# Patient Record
Sex: Female | Born: 1960 | Race: Black or African American | Hispanic: No | Marital: Married | Smoking: Current every day smoker
Health system: Southern US, Community
[De-identification: ages and names within clinical notes are randomized; demographics above are authoritative.]

## PROBLEM LIST (undated history)

## (undated) DIAGNOSIS — E7800 Pure hypercholesterolemia, unspecified: Secondary | ICD-10-CM

## (undated) DIAGNOSIS — I10 Essential (primary) hypertension: Secondary | ICD-10-CM

## (undated) HISTORY — PX: CHOLECYSTECTOMY: SHX55

## (undated) HISTORY — PX: ABDOMINAL HYSTERECTOMY: SHX81

## (undated) HISTORY — PX: SHOULDER SURGERY: SHX246

## (undated) HISTORY — PX: KNEE SURGERY: SHX244

---

## 2005-09-09 ENCOUNTER — Emergency Department (HOSPITAL_COMMUNITY): Admission: EM | Admit: 2005-09-09 | Discharge: 2005-09-09 | Payer: Self-pay | Admitting: Family Medicine

## 2006-02-27 ENCOUNTER — Emergency Department (HOSPITAL_COMMUNITY): Admission: EM | Admit: 2006-02-27 | Discharge: 2006-02-27 | Payer: Self-pay | Admitting: Family Medicine

## 2006-04-11 ENCOUNTER — Emergency Department (HOSPITAL_COMMUNITY): Admission: EM | Admit: 2006-04-11 | Discharge: 2006-04-11 | Payer: Self-pay | Admitting: Emergency Medicine

## 2007-03-11 ENCOUNTER — Emergency Department (HOSPITAL_COMMUNITY): Admission: EM | Admit: 2007-03-11 | Discharge: 2007-03-11 | Payer: Self-pay | Admitting: Emergency Medicine

## 2007-03-11 ENCOUNTER — Ambulatory Visit (HOSPITAL_COMMUNITY): Admission: RE | Admit: 2007-03-11 | Discharge: 2007-03-11 | Payer: Self-pay | Admitting: Emergency Medicine

## 2007-03-13 ENCOUNTER — Emergency Department (HOSPITAL_COMMUNITY): Admission: EM | Admit: 2007-03-13 | Discharge: 2007-03-13 | Payer: Self-pay | Admitting: Emergency Medicine

## 2011-10-23 ENCOUNTER — Emergency Department (HOSPITAL_COMMUNITY)
Admission: EM | Admit: 2011-10-23 | Discharge: 2011-10-23 | Disposition: A | Discharge status: Home / Self Care | Payer: No Typology Code available for payment source | Attending: Emergency Medicine | Admitting: Emergency Medicine

## 2011-10-23 DIAGNOSIS — R51 Headache: Secondary | ICD-10-CM | POA: Insufficient documentation

## 2011-10-25 ENCOUNTER — Inpatient Hospital Stay (HOSPITAL_COMMUNITY)
Admission: RE | Admit: 2011-10-25 | Discharge: 2011-10-25 | Disposition: A | Discharge status: Home / Self Care | Payer: No Typology Code available for payment source | Source: Ambulatory Visit | Attending: Family Medicine | Admitting: Family Medicine

## 2013-06-12 ENCOUNTER — Other Ambulatory Visit: Payer: Self-pay

## 2013-06-12 ENCOUNTER — Encounter (HOSPITAL_COMMUNITY): Payer: Self-pay | Admitting: *Deleted

## 2013-06-12 ENCOUNTER — Emergency Department (HOSPITAL_COMMUNITY): Payer: BC Managed Care – PPO

## 2013-06-12 ENCOUNTER — Emergency Department (HOSPITAL_COMMUNITY)
Admission: EM | Admit: 2013-06-12 | Discharge: 2013-06-12 | Discharge status: Against Medical Advice | Payer: BC Managed Care – PPO | Attending: Emergency Medicine | Admitting: Emergency Medicine

## 2013-06-12 DIAGNOSIS — R110 Nausea: Secondary | ICD-10-CM | POA: Insufficient documentation

## 2013-06-12 DIAGNOSIS — E7800 Pure hypercholesterolemia, unspecified: Secondary | ICD-10-CM | POA: Insufficient documentation

## 2013-06-12 DIAGNOSIS — R079 Chest pain, unspecified: Secondary | ICD-10-CM | POA: Insufficient documentation

## 2013-06-12 DIAGNOSIS — Z79899 Other long term (current) drug therapy: Secondary | ICD-10-CM | POA: Insufficient documentation

## 2013-06-12 DIAGNOSIS — E876 Hypokalemia: Secondary | ICD-10-CM

## 2013-06-12 DIAGNOSIS — F17200 Nicotine dependence, unspecified, uncomplicated: Secondary | ICD-10-CM | POA: Insufficient documentation

## 2013-06-12 DIAGNOSIS — I10 Essential (primary) hypertension: Secondary | ICD-10-CM | POA: Insufficient documentation

## 2013-06-12 HISTORY — DX: Essential (primary) hypertension: I10

## 2013-06-12 HISTORY — DX: Pure hypercholesterolemia, unspecified: E78.00

## 2013-06-12 LAB — COMPREHENSIVE METABOLIC PANEL
ALT: 20 U/L (ref 0–35)
AST: 30 U/L (ref 0–37)
Alkaline Phosphatase: 101 U/L (ref 39–117)
CO2: 26 mEq/L (ref 19–32)
Calcium: 10 mg/dL (ref 8.4–10.5)
Chloride: 99 mEq/L (ref 96–112)
GFR calc Af Amer: >90 mL/min (ref >90)
GFR calc non Af Amer: >90 mL/min (ref >90)
Glucose, Bld: 132 mg/dL — ABNORMAL HIGH (ref 70–99)
Potassium: 3.3 mEq/L — ABNORMAL LOW (ref 3.5–5.1)
Sodium: 135 mEq/L (ref 135–145)
Total Bilirubin: 0.3 mg/dL (ref 0.3–1.2)

## 2013-06-12 LAB — CBC WITH DIFFERENTIAL/PLATELET
Basophils Absolute: 0.1 10*3/uL (ref 0.0–0.1)
Eosinophils Relative: 3 % (ref 0–5)
Lymphocytes Relative: 46 % (ref 12–46)
Lymphs Abs: 4.9 10*3/uL — ABNORMAL HIGH (ref 0.7–4.0)
MCV: 94.8 fL (ref 78.0–100.0)
Neutro Abs: 4.6 10*3/uL (ref 1.7–7.7)
Platelets: 278 10*3/uL (ref 150–400)
RBC: 4.26 MIL/uL (ref 3.87–5.11)
RDW: 12.6 % (ref 11.5–15.5)
WBC: 10.5 10*3/uL (ref 4.0–10.5)

## 2013-06-12 MED ORDER — NITROGLYCERIN 0.4 MG SL SUBL
0.4000 mg | SUBLINGUAL_TABLET | SUBLINGUAL | Status: DC | PRN
  Administered 2013-06-12: 0.4 mg via SUBLINGUAL
  Filled 2013-06-12: qty 25

## 2013-06-12 MED ORDER — POTASSIUM CHLORIDE CRYS ER 20 MEQ PO TBCR
40.0000 meq | EXTENDED_RELEASE_TABLET | Freq: Once | ORAL | Status: AC
  Administered 2013-06-12: 40 meq via ORAL
  Filled 2013-06-12: qty 2

## 2013-06-12 MED ORDER — ASPIRIN 325 MG PO TABS
325.0000 mg | ORAL_TABLET | Freq: Once | ORAL | Status: AC
  Administered 2013-06-12: 325 mg via ORAL
  Filled 2013-06-12: qty 1

## 2013-06-12 NOTE — ED Provider Notes (Signed)
History     CSN: [id number]  Arrival date & time 06/12/13  0039   First MD Initiated Contact with Patient 06/12/13 0245      Chief Complaint  Patient presents with  . Chest Pain     Patient is a 52 y.o. female presenting with chest pain. The history is provided by the patient.  Chest Pain Pain location:  Substernal area Pain quality: burning   Radiates to: bilateral back. Pain radiates to the back: yes   Pain severity:  Moderate Duration: several hours. Timing:  Constant Progression:  Worsening Chronicity:  New Relieved by:  None tried Worsened by:  Nothing tried Associated symptoms: nausea   Associated symptoms: no abdominal pain, no diaphoresis, no fever, no shortness of breath, no syncope and not vomiting     Past Medical History  Diagnosis Date  . Hypertension   . High cholesterol     Past Surgical History  Procedure Laterality Date  . Cholecystectomy    . Knee surgery    . Shoulder surgery    . Abdominal hysterectomy      History reviewed. No pertinent family history.  History  Substance Use Topics  . Smoking status: Current Every Day Smoker  . Smokeless tobacco: Not on file  . Alcohol Use: No    OB History   Grav Para Term Preterm Abortions TAB SAB Ect Mult Living                  Review of Systems  Constitutional: Negative for fever and diaphoresis.  Respiratory: Negative for shortness of breath.   Cardiovascular: Positive for chest pain. Negative for syncope.  Gastrointestinal: Positive for nausea. Negative for vomiting and abdominal pain.  All other systems reviewed and are negative.    Allergies  Review of patient's allergies indicates no known allergies.  Home Medications   Current Outpatient Rx  Name  Route  Sig  Dispense  Refill  . atorvastatin (LIPITOR) 20 MG tablet   Oral   Take 20 mg by mouth daily.         Marland Kitchen losartan-hydrochlorothiazide (HYZAAR) 50-12.5 MG per tablet   Oral   Take 1 tablet by mouth daily.            BP 138/74  Pulse 72  Temp(Src) 97.8 F (36.6 C) (Oral)  Resp 18  SpO2 97% BP 147/76  Pulse 69  Temp(Src) 97.8 F (36.6 C) (Oral)  Resp 17  SpO2 95%   Physical Exam CONSTITUTIONAL: Well developed/well nourished HEAD: Normocephalic/atraumatic EYES: EOMI/PERRL ENMT: Mucous membranes moist NECK: supple no meningeal signs SPINE:entire spine nontender CV: S1/S2 noted, no murmurs/rubs/gallops noted LUNGS: Lungs are clear to auscultation bilaterally, no apparent distress Chest - no tenderness to palpation of chest wall ABDOMEN: soft, nontender, no rebound or guarding GU:no cva tenderness NEURO: Pt is awake/alert, moves all extremitiesx4 EXTREMITIES: pulses normal, full ROM SKIN: warm, color normal PSYCH: no abnormalities of mood noted  ED Course  Procedures (including critical care time)  Labs Reviewed  CBC WITH DIFFERENTIAL - Abnormal; Notable for the following:    Lymphs Abs 4.9 (*)    All other components within normal limits  COMPREHENSIVE METABOLIC PANEL - Abnormal; Notable for the following:    Potassium 3.3 (*)    Glucose, Bld 132 (*)    All other components within normal limits  POCT I-STAT TROPONIN I   Dg Chest 2 View  06/12/2013   *RADIOLOGY REPORT*  Clinical Data: Chest pain and shortness of  breath.  CHEST - 2 VIEW  Comparison: No priors.  Findings: Lung volumes are normal.  No consolidative airspace disease.  No pleural effusions.  No pneumothorax.  No pulmonary nodule or mass noted.  Pulmonary vasculature and the cardiomediastinal silhouette are within normal limits.  Surgical clips project over the right upper quadrant of the abdomen, likely from prior cholecystectomy.  IMPRESSION: 1. No radiographic evidence of acute cardiopulmonary disease.   Original Report Authenticated By: Trudie Reed, M.D.    Pt improved after ASA and nitroglycerin.  She denies ever having this pain previously However she does have risk factors for CAD, and she appeared  uncomfortable on my initial evaluation I recommended admission and possible further cardiac evaluation and potentially stress imaging Pt refused She can f/u with her PCP I doubt PE or aortic dissection but concern for possible ACS   I discussed risk of death/disability of leaving against medical advice and the patient accepted these risks.  The patient is awake/alert able to make decisions, and not intoxicated Patient discharged against medical advice.  Husband at bedside also aware of my advice.  Pt still decided to sign out AMA      MDM  Nursing notes including past medical history and social history reviewed and considered in documentation xrays reviewed and considered Labs/vital reviewed and considered        Date: 06/12/2013  Rate: 81  Rhythm: normal sinus rhythm  QRS Axis: normal  Intervals: normal  ST/T Wave abnormalities: nonspecific ST changes  Conduction Disutrbances:none  Narrative Interpretation:   Old EKG Reviewed: unchanged    Joya Gaskins, MD 06/12/13 [phone]

## 2013-06-12 NOTE — ED Notes (Signed)
Pt. Left AMA. States "I will follow up with my doctor and get the stress test done". Explained benefits and risks to staying and leaving. Pt. Verbalized understanding.

## 2013-06-12 NOTE — ED Notes (Signed)
Pt states that she has pain in her center of her chest that is a burning pain that wraps around to her back. Pt states she took antiacids for the pain but that they have not help. Pt took antiacids about an hour ago.

## 2013-06-12 NOTE — ED Notes (Signed)
Dr. Wickline at bedside.  

## 2013-06-12 NOTE — ED Notes (Signed)
PT declines to take second nitroglycerin SL

## 2014-08-22 ENCOUNTER — Emergency Department (HOSPITAL_COMMUNITY): Payer: Self-pay

## 2014-08-22 ENCOUNTER — Inpatient Hospital Stay (HOSPITAL_COMMUNITY)
Admission: EM | Admit: 2014-08-22 | Discharge: 2014-08-24 | DRG: 312 | Disposition: A | Discharge status: Home / Self Care | Payer: BC Managed Care – PPO | Attending: Internal Medicine | Admitting: Internal Medicine

## 2014-08-22 ENCOUNTER — Encounter (HOSPITAL_COMMUNITY): Payer: Self-pay | Admitting: Emergency Medicine

## 2014-08-22 DIAGNOSIS — Z8673 Personal history of transient ischemic attack (TIA), and cerebral infarction without residual deficits: Secondary | ICD-10-CM

## 2014-08-22 DIAGNOSIS — E7800 Pure hypercholesterolemia, unspecified: Secondary | ICD-10-CM | POA: Diagnosis present

## 2014-08-22 DIAGNOSIS — Z720 Tobacco use: Secondary | ICD-10-CM | POA: Diagnosis present

## 2014-08-22 DIAGNOSIS — R55 Syncope and collapse: Principal | ICD-10-CM | POA: Diagnosis present

## 2014-08-22 DIAGNOSIS — I10 Essential (primary) hypertension: Secondary | ICD-10-CM | POA: Diagnosis present

## 2014-08-22 DIAGNOSIS — F17200 Nicotine dependence, unspecified, uncomplicated: Secondary | ICD-10-CM

## 2014-08-22 LAB — COMPREHENSIVE METABOLIC PANEL
ALBUMIN: 3.6 g/dL (ref 3.5–5.2)
ALK PHOS: 85 U/L (ref 39–117)
ALT: 41 U/L — AB (ref 0–35)
ANION GAP: 13 (ref 5–15)
AST: 26 U/L (ref 0–37)
BILIRUBIN TOTAL: 0.2 mg/dL — AB (ref 0.3–1.2)
BUN: 11 mg/dL (ref 6–23)
CHLORIDE: 105 meq/L (ref 96–112)
CO2: 25 mEq/L (ref 19–32)
Calcium: 9.2 mg/dL (ref 8.4–10.5)
Creatinine, Ser: 0.61 mg/dL (ref 0.50–1.10)
GFR calc Af Amer: >90 mL/min (ref >90)
GFR calc non Af Amer: >90 mL/min (ref >90)
Glucose, Bld: 105 mg/dL — ABNORMAL HIGH (ref 70–99)
POTASSIUM: 4.1 meq/L (ref 3.7–5.3)
Sodium: 143 mEq/L (ref 137–147)
Total Protein: 7.1 g/dL (ref 6.0–8.3)

## 2014-08-22 LAB — CBC WITH DIFFERENTIAL/PLATELET
BASOS PCT: 0 % (ref 0–1)
Basophils Absolute: 0 10*3/uL (ref 0.0–0.1)
Eosinophils Absolute: 0.3 10*3/uL (ref 0.0–0.7)
Eosinophils Relative: 4 % (ref 0–5)
HCT: 39.6 % (ref 36.0–46.0)
HEMOGLOBIN: 13.9 g/dL (ref 12.0–15.0)
LYMPHS ABS: 2.5 10*3/uL (ref 0.7–4.0)
Lymphocytes Relative: 36 % (ref 12–46)
MCH: 33.6 pg (ref 26.0–34.0)
MCHC: 35.1 g/dL (ref 30.0–36.0)
MCV: 95.7 fL (ref 78.0–100.0)
MONOS PCT: 4 % (ref 3–12)
Monocytes Absolute: 0.3 10*3/uL (ref 0.1–1.0)
NEUTROS ABS: 3.9 10*3/uL (ref 1.7–7.7)
NEUTROS PCT: 56 % (ref 43–77)
Platelets: 200 10*3/uL (ref 150–400)
RBC: 4.14 MIL/uL (ref 3.87–5.11)
RDW: 12.8 % (ref 11.5–15.5)
WBC: 7 10*3/uL (ref 4.0–10.5)

## 2014-08-22 LAB — D-DIMER, QUANTITATIVE: D-Dimer, Quant: 0.32 ug/mL-FEU (ref 0.00–0.48)

## 2014-08-22 LAB — RAPID URINE DRUG SCREEN, HOSP PERFORMED
AMPHETAMINES: NOT DETECTED
BARBITURATES: NOT DETECTED
BENZODIAZEPINES: NOT DETECTED
COCAINE: NOT DETECTED
Opiates: NOT DETECTED
TETRAHYDROCANNABINOL: NOT DETECTED

## 2014-08-22 LAB — TROPONIN I: Troponin I: <0.3 ng/mL (ref <0.30)

## 2014-08-22 MED ORDER — SODIUM CHLORIDE 0.9 % IV SOLN
INTRAVENOUS | Status: DC
  Administered 2014-08-22 – 2014-08-24 (×4): via INTRAVENOUS

## 2014-08-22 MED ORDER — ACETAMINOPHEN 650 MG RE SUPP: 650.0000 mg | Freq: Four times a day (QID) | RECTAL | Status: DC | PRN

## 2014-08-22 MED ORDER — NICOTINE 21 MG/24HR TD PT24
21.0000 mg | MEDICATED_PATCH | Freq: Every day | TRANSDERMAL | Status: DC
  Administered 2014-08-23 – 2014-08-24 (×2): 21 mg via TRANSDERMAL
  Filled 2014-08-22 (×2): qty 1

## 2014-08-22 MED ORDER — ASPIRIN EC 81 MG PO TBEC
81.0000 mg | DELAYED_RELEASE_TABLET | Freq: Every day | ORAL | Status: DC
  Administered 2014-08-23 – 2014-08-24 (×2): 81 mg via ORAL
  Filled 2014-08-22 (×2): qty 1

## 2014-08-22 MED ORDER — ENOXAPARIN SODIUM 40 MG/0.4ML ~~LOC~~ SOLN
40.0000 mg | SUBCUTANEOUS | Status: DC
  Administered 2014-08-23 – 2014-08-24 (×2): 40 mg via SUBCUTANEOUS
  Filled 2014-08-22 (×2): qty 0.4

## 2014-08-22 MED ORDER — ACETAMINOPHEN 325 MG PO TABS
650.0000 mg | ORAL_TABLET | Freq: Four times a day (QID) | ORAL | Status: DC | PRN
  Administered 2014-08-22 – 2014-08-23 (×3): 650 mg via ORAL
  Filled 2014-08-22 (×3): qty 2

## 2014-08-22 NOTE — ED Notes (Signed)
Patient transported to CT 

## 2014-08-22 NOTE — ED Notes (Signed)
Received pt from home with c/o Dizziness and syncopal episode lasting about 1 minute. Symptoms onset about 45 mins PTA. Pt c/o nausea but currently relieved. Pt has generalized weakness. Initial pulse ox for EMS was 91-92% on room air. Pt given 2 L O2 by EMS pulse ox increased to 95-96%.

## 2014-08-22 NOTE — ED Notes (Signed)
Pt escorted to bathroom without difficulty. When escorted back to room, pt became dizzy and weak at nurses' station. Placed pt in a chair before she could fall and brought her back to her room in a wheelchair. Vital signs taken and were WNL.

## 2014-08-22 NOTE — ED Provider Notes (Signed)
CSN: [id number]     Arrival date & time 08/22/14  1721 History   First MD Initiated Contact with Patient 08/22/14 1735     Chief Complaint  Patient presents with  . Dizziness  . Loss of Consciousness      HPI Received pt from home with c/o Dizziness and syncopal episode lasting about 1 minute. Symptoms onset about 45 mins PTA. Pt c/o nausea but currently relieved. Pt has generalized weakness. Initial pulse ox for EMS was 91-92% on room air. Pt given 2 L O2 by EMS pulse ox increased to 95-96%.  Past Medical History  Diagnosis Date  . Hypertension   . High cholesterol    Past Surgical History  Procedure Laterality Date  . Cholecystectomy    . Knee surgery    . Shoulder surgery    . Abdominal hysterectomy     No family history on file. History  Substance Use Topics  . Smoking status: Current Every Day Smoker -- 1.00 packs/day  . Smokeless tobacco: Not on file  . Alcohol Use: No   OB History   Grav Para Term Preterm Abortions TAB SAB Ect Mult Living                 Review of Systems  All other systems reviewed and are negative  Allergies  Review of patient's allergies indicates no known allergies.  Home Medications   Prior to Admission medications   Medication Sig Start Date End Date Taking? Authorizing Provider  Cholecalciferol (VITAMIN D PO) Take 1 tablet by mouth daily.   Yes Historical Provider, MD  Cyanocobalamin (VITAMIN B-12 PO) Take 1 tablet by mouth daily.   Yes Historical Provider, MD  losartan-hydrochlorothiazide (HYZAAR) 50-12.5 MG per tablet Take 1 tablet by mouth daily.   Yes Historical Provider, MD  Multiple Vitamin (MULTIVITAMIN WITH MINERALS) TABS tablet Take 1 tablet by mouth daily.   Yes Historical Provider, MD   BP 113/65  Pulse 66  Temp(Src) 98 F (36.7 C) (Oral)  Resp 17  Ht  (1.702 m)  SpO2 93% Physical Exam Physical Exam  Nursing note and vitals reviewed. Constitutional: She is oriented to person, place, and time. She appears  well-developed and well-nourished. No distress.  HENT:  Head: Normocephalic and atraumatic.  Eyes: Pupils are equal, round, and reactive to light.  Neck: Normal range of motion.  Cardiovascular: Normal rate and intact distal pulses.   Pulmonary/Chest: No respiratory distress.  Abdominal: Normal appearance. She exhibits no distension.  Musculoskeletal: Normal range of motion.  Neurological: She is alert and oriented to person, place, and time. No cranial nerve deficit.  no lateralizing weakness.  No truncal ataxia. Skin: Skin is warm and dry. No rash noted.  Psychiatric: She has a normal mood and affect. Her behavior is normal.   ED Course  Procedures (including critical care time) Labs Review Labs Reviewed  COMPREHENSIVE METABOLIC PANEL - Abnormal; Notable for the following:    Glucose, Bld 105 (*)    ALT 41 (*)    Total Bilirubin 0.2 (*)    All other components within normal limits  CBC WITH DIFFERENTIAL  TROPONIN I  D-DIMER, QUANTITATIVE  URINE RAPID DRUG SCREEN (HOSP PERFORMED)    Imaging Review Ct Head Wo Contrast  08/22/2014   CLINICAL DATA:  Dizziness.  Syncope.  EXAM: CT HEAD WITHOUT CONTRAST  TECHNIQUE: Contiguous axial images were obtained from the base of the skull through the vertex without intravenous contrast.  COMPARISON:  MRI 09/18/2009.  FINDINGS: No intra-axial or extra-axial pathologic shoulder blood collection. No mass lesion. No hydrocephalus. No hemorrhage. No acute bony abnormality. Visualized paranasal sinuses and mastoids are clear.  IMPRESSION: No acute abnormality.   Electronically Signed   By: Maisie Fus  Register   On: 08/22/2014 18:28   Dg Chest Portable 1 View  08/22/2014   CLINICAL DATA:  DIZZINESS LOSS OF CONSCIOUSNESS  EXAM: PORTABLE CHEST - 1 VIEW  COMPARISON:  06/12/2013  FINDINGS: Mild subsegmental atelectasis or scarring at the left lung base, stable. Lungs otherwise clear. Heart size and mediastinal contours are within normal limits. No effusion.  Visualized skeletal structures are unremarkable.  IMPRESSION: No acute cardiopulmonary disease.   Electronically Signed   By: Oley Balm M.D.   On: 08/22/2014 21:15     EKG Interpretation   Date/Time:  Sunday August 22 2014 17:33:40 EDT Ventricular Rate:  64 PR Interval:  189 QRS Duration: 83 QT Interval:  440 QTC Calculation: 454 R Axis:   55 Text Interpretation:  Age not entered, assumed to be  53 years old for  purpose of ECG interpretation Sinus rhythm Probable anterior infarct, old  No significant change since last tracing Confirmed by Twylla Arceneaux  MD, Jeorge Reister  [phone]) on 08/22/2014 5:51:15 PM     Patient will be admitted for syncope.  Stable throughout her stay in the emergency department. MDM   Final diagnoses:  Syncope, unspecified syncope type        Nelia Shi, MD 08/22/14 2249

## 2014-08-22 NOTE — H&P (Signed)
[facility] History and Physical  Lynn Pratt JOA:[id number] DOB: January 12, 1961 DOA: 08/22/2014  Referring physician:  PCP: Sid Falcon, MD  Specialists:   Chief Complaint: Syncope  HPI: Lynn Pratt is a 53 y.o. female  BF PMHx HTN, HLD, nicotine abuse Received pt from home with c/o Dizziness and syncopal episode lasting about 1 minute. States was sitting on couch watching te syncope vision experience vertigo, and then passed out. Husband states he came in to room found her passed out on the couch placed her on the floor and called 911. Negative loss of bowel or bladder control, when aroused initially was not able to answer questions appropriately. Patient states the next thing she remembers is lying on the floor with her husband standing over her. States negative previous episode, negative MI negative CVA. sees  Pt c/o nausea but currently relieved. Pt has generalized weakness. Initial pulse ox for EMS was 91-92% on room air. Pt given 2 L O2 by EMS pulse ox increased to 95-96%. States has had a similar episode when she went to the restroom here in the ED; vertigo without passing out. UDS negative    Review of Systems: The patient denies anorexia, fever, weight loss,, vision loss, decreased hearing, hoarseness, chest pain, dyspnea on exertion, peripheral edema, balance deficits, hemoptysis, abdominal pain, melena, hematochezia, severe indigestion/heartburn, hematuria, incontinence, genital sores, muscle weakness, suspicious skin lesions, transient blindness, difficulty walking, depression, unusual weight change, abnormal bleeding, enlarged lymph nodes, angioedema, and breast masses.    TRAVEL HISTORY: None    Consultants:  NA  Procedure/Significant Events:  8/30 CT head without contrast; no acute abnormality 8/30 PCXR; no acute cardiopulmonary disease   Culture  8/30 urine pending 8/30 blood pending   Antibiotics:  NA   DVT prophylaxis:  Lovenox   Devices   NA   LINES / TUBES:  8/30 20ga left hand   Past Medical History  Diagnosis Date  . Hypertension   . High cholesterol    Past Surgical History  Procedure Laterality Date  . Cholecystectomy    . Knee surgery    . Shoulder surgery    . Abdominal hysterectomy     Social History: Smokes 1 PPD x30 years, negative EtOH, negative drugs Lives at home with husband and son    No Known Allergies  No family history on file.   Prior to Admission medications   Medication Sig Start Date End Date Taking? Authorizing Provider  Cholecalciferol (VITAMIN D PO) Take 1 tablet by mouth daily.   Yes Historical Provider, MD  Cyanocobalamin (VITAMIN B-12 PO) Take 1 tablet by mouth daily.   Yes Historical Provider, MD  losartan-hydrochlorothiazide (HYZAAR) 50-12.5 MG per tablet Take 1 tablet by mouth daily.   Yes Historical Provider, MD  Multiple Vitamin (MULTIVITAMIN WITH MINERALS) TABS tablet Take 1 tablet by mouth daily.   Yes Historical Provider, MD   Physical Exam: Filed Vitals:   08/22/14 2230 08/22/14 2245 08/22/14 2300 08/22/14 2315  BP: 118/59 137/79 124/64 137/63  Pulse: 65 68 64 65  Temp:      TempSrc:      Resp: Height:      SpO2: 94% 96% 94% 96%     General:  A./O. x4, NAD,  Eyes: Pupils equal round reactive to light and accommodation  Neck: Negative JVD negative lymphadenopathy  Cardiovascular: Regular rhythm and rate, negative murmurs rubs gallops, normal S1/S2  Respiratory: Diffuse rhonchi  Abdomen: Soft, nontender, nondistended, plus  bowel sound  Skin: Negative lesions  Musculoskeletal: Negative pedal edema  Neurologic: Cranial nerve II through XII intact, tongue/uvula midline, strength and all extremities 5/5, sensation intact, quick finger touch is within normal limits bilateral, finger nose finger within normal limits bilateral, DTR at knee bilateral +1, did not ambulate patient  Labs on Admission:  Basic Metabolic Panel:  Recent Labs Lab  08/22/14 1803  NA 143  K 4.1  CL 105  CO2 25  GLUCOSE 105*  BUN 11  CREATININE 0.61  CALCIUM 9.2   Liver Function Tests:  Recent Labs Lab 08/22/14 1803  AST 26  ALT 41*  ALKPHOS 85  BILITOT 0.2*  PROT 7.1  ALBUMIN 3.6   No results found for this basename: LIPASE, AMYLASE,  in the last 168 hours No results found for this basename: AMMONIA,  in the last 168 hours CBC:  Recent Labs Lab 08/22/14 1803  WBC 7.0  NEUTROABS 3.9  HGB 13.9  HCT 39.6  MCV 95.7  PLT 200   Cardiac Enzymes:  Recent Labs Lab 08/22/14 1803  TROPONINI <0.30    BNP (last 3 results) No results found for this basename: PROBNP,  in the last 8760 hours CBG: No results found for this basename: GLUCAP,  in the last 168 hours  Radiological Exams on Admission: Ct Head Wo Contrast  08/22/2014   CLINICAL DATA:  Dizziness.  Syncope.  EXAM: CT HEAD WITHOUT CONTRAST  TECHNIQUE: Contiguous axial images were obtained from the base of the skull through the vertex without intravenous contrast.  COMPARISON:  MRI 09/18/2009.  FINDINGS: No intra-axial or extra-axial pathologic shoulder blood collection. No mass lesion. No hydrocephalus. No hemorrhage. No acute bony abnormality. Visualized paranasal sinuses and mastoids are clear.  IMPRESSION: No acute abnormality.   Electronically Signed   By: Maisie Fus  Register   On: 08/22/2014 18:28   Dg Chest Portable 1 View  08/22/2014   CLINICAL DATA:  DIZZINESS LOSS OF CONSCIOUSNESS  EXAM: PORTABLE CHEST - 1 VIEW  COMPARISON:  06/12/2013  FINDINGS: Mild subsegmental atelectasis or scarring at the left lung base, stable. Lungs otherwise clear. Heart size and mediastinal contours are within normal limits. No effusion. Visualized skeletal structures are unremarkable.  IMPRESSION: No acute cardiopulmonary disease.   Electronically Signed   By: Oley Balm M.D.   On: 08/22/2014 21:15    EKG: NSR, nonspecific ST-T wave changes in leads 1, aVL, V1,  V2   Assessment/Plan Active Problems:   Syncope and collapse   HTN (hypertension)   Nicotine abuse   Syncope -Patient with HEART score=5. Will obtain echocardiogram -Trend troponins, obtain BNP -Obtain brain MRI, and if positive for CVA obtain carotid Dopplers, otherwise no further workup required -Obtain TSH -Obtain UDS -Obtain lipid panel, hemoglobin A1c  HTN. -Currently patient's BP controlled without medication, hold home medications -Obtain orthostatic vitals  Nicotine abuse -Placed nicotine patch    Code Status: Full Family Communication: Husband and son present (indicate person spoken with, if applicable, with phone number if by telephone) Disposition Plan: Completion of syncope workup  Time spent: 60 minute Drema [place] [facility] Pager [phone]  If 7PM-7AM, please contact night-coverage [email or web address] Password [facility] 08/22/2014, 11:47 PM

## 2014-08-23 ENCOUNTER — Encounter (HOSPITAL_COMMUNITY): Payer: Self-pay | Admitting: *Deleted

## 2014-08-23 ENCOUNTER — Inpatient Hospital Stay (HOSPITAL_COMMUNITY): Payer: BC Managed Care – PPO

## 2014-08-23 LAB — COMPREHENSIVE METABOLIC PANEL
ALK PHOS: 73 U/L (ref 39–117)
ALT: 32 U/L (ref 0–35)
ANION GAP: 14 (ref 5–15)
AST: 21 U/L (ref 0–37)
Albumin: 3.1 g/dL — ABNORMAL LOW (ref 3.5–5.2)
BUN: 10 mg/dL (ref 6–23)
CO2: 17 mEq/L — ABNORMAL LOW (ref 19–32)
CREATININE: 0.55 mg/dL (ref 0.50–1.10)
Calcium: 8.5 mg/dL (ref 8.4–10.5)
Chloride: 107 mEq/L (ref 96–112)
GFR calc non Af Amer: >90 mL/min (ref >90)
GLUCOSE: 128 mg/dL — AB (ref 70–99)
POTASSIUM: 3.7 meq/L (ref 3.7–5.3)
Sodium: 138 mEq/L (ref 137–147)
TOTAL PROTEIN: 6.6 g/dL (ref 6.0–8.3)
Total Bilirubin: 0.3 mg/dL (ref 0.3–1.2)

## 2014-08-23 LAB — URINALYSIS, ROUTINE W REFLEX MICROSCOPIC
Bilirubin Urine: NEGATIVE
Glucose, UA: NEGATIVE mg/dL
HGB URINE DIPSTICK: NEGATIVE
Ketones, ur: NEGATIVE mg/dL
Leukocytes, UA: NEGATIVE
Nitrite: NEGATIVE
PROTEIN: NEGATIVE mg/dL
Specific Gravity, Urine: 1.015 (ref 1.005–1.030)
Urobilinogen, UA: 0.2 mg/dL (ref 0.0–1.0)
pH: 5 (ref 5.0–8.0)

## 2014-08-23 LAB — LIPID PANEL
CHOLESTEROL: 221 mg/dL — AB (ref 0–200)
HDL: 33 mg/dL — ABNORMAL LOW (ref >39)
LDL Cholesterol: 142 mg/dL — ABNORMAL HIGH (ref 0–99)
Total CHOL/HDL Ratio: 6.7 RATIO
Triglycerides: 232 mg/dL — ABNORMAL HIGH (ref <150)
VLDL: 46 mg/dL — AB (ref 0–40)

## 2014-08-23 LAB — TROPONIN I
Troponin I: <0.3 ng/mL (ref <0.30)
Troponin I: <0.3 ng/mL (ref <0.30)

## 2014-08-23 LAB — TSH: TSH: 1.47 u[IU]/mL (ref 0.350–4.500)

## 2014-08-23 LAB — CBC
HCT: 41.5 % (ref 36.0–46.0)
HEMOGLOBIN: 14.7 g/dL (ref 12.0–15.0)
MCH: 34.1 pg — AB (ref 26.0–34.0)
MCHC: 35.4 g/dL (ref 30.0–36.0)
MCV: 96.3 fL (ref 78.0–100.0)
Platelets: 207 10*3/uL (ref 150–400)
RBC: 4.31 MIL/uL (ref 3.87–5.11)
RDW: 12.7 % (ref 11.5–15.5)
WBC: 9.6 10*3/uL (ref 4.0–10.5)

## 2014-08-23 LAB — CBC WITH DIFFERENTIAL/PLATELET
BASOS PCT: 0 % (ref 0–1)
Basophils Absolute: 0 10*3/uL (ref 0.0–0.1)
Eosinophils Absolute: 0.3 10*3/uL (ref 0.0–0.7)
Eosinophils Relative: 3 % (ref 0–5)
HCT: 39.1 % (ref 36.0–46.0)
Hemoglobin: 13.3 g/dL (ref 12.0–15.0)
Lymphocytes Relative: 44 % (ref 12–46)
Lymphs Abs: 3.8 10*3/uL (ref 0.7–4.0)
MCH: 33.6 pg (ref 26.0–34.0)
MCHC: 34 g/dL (ref 30.0–36.0)
MCV: 98.7 fL (ref 78.0–100.0)
Monocytes Absolute: 0.3 10*3/uL (ref 0.1–1.0)
Monocytes Relative: 4 % (ref 3–12)
NEUTROS ABS: 4.2 10*3/uL (ref 1.7–7.7)
NEUTROS PCT: 49 % (ref 43–77)
PLATELETS: 187 10*3/uL (ref 150–400)
RBC: 3.96 MIL/uL (ref 3.87–5.11)
RDW: 12.8 % (ref 11.5–15.5)
WBC: 8.7 10*3/uL (ref 4.0–10.5)

## 2014-08-23 LAB — CREATININE, SERUM
Creatinine, Ser: 0.58 mg/dL (ref 0.50–1.10)
GFR calc Af Amer: >90 mL/min (ref >90)

## 2014-08-23 LAB — HEMOGLOBIN A1C
HEMOGLOBIN A1C: 6 % — AB (ref <5.7)
Mean Plasma Glucose: 126 mg/dL — ABNORMAL HIGH (ref <117)

## 2014-08-23 LAB — MAGNESIUM: MAGNESIUM: 1.6 mg/dL (ref 1.5–2.5)

## 2014-08-23 NOTE — Progress Notes (Signed)
[facility] PROGRESS NOTE  Lynn Pratt ZOX:[id number] DOB: 04-Nov-1961 DOA: 08/22/2014 PCP: Sid Falcon, MD  Interim Summary Patient is a pleasant 53 year old with a past medical history of hypertension, admitted to the medicine service on 08/22/2014 presenting with a syncope event. Patient passing out on a couch for approximately 1 minute. Prior to that patient had reported feeling dizzy and lightheaded. She was admitted to telemetry where troponin remained negative x3 sets. There are no changes on cardiac monitoring. She was administered IV fluids. Orthostatics checked this morning were negative. Patient reporting feeling better. Currently pending transthoracic echocardiogram and MRI of the brain.  Assessment/Plan: 1. Syncope -Suspect vasovagal etiology -Workup thus far has revealed 3 negative troponins, normal TSH, normal chemistries -Pending transthoracic echocardiogram and MRI of brain -Patient reporting feeling better this morning, currently receiving IV fluids.  2. History of hypertension. -Hydrochlorothiazide held given possibility of dehydration/hypotension  -Patient's systolic blood pressures fluctuate between 120's and 140's  Code Status: Full code Family Communication:  Disposition Plan: Awaiting transthoracic echocardiogram and MRI, anticipate discharge in the next 24 hours   HPI/Subjective: Patient reported feeling much better this morning, denies chest pain, shortness of breath, dizziness, lightheadedness.  Objective: Filed Vitals:   08/23/14 1337  BP: 143/78  Pulse: 68  Temp: 98.2 F (36.8 C)  Resp: 18    Intake/Output Summary (Last 24 hours) at 08/23/14 1740 Last data filed at 08/23/14 0658  Gross per 24 hour  Intake 763.33 ml  Output      0 ml  Net 763.33 ml   Filed Weights   08/23/14 0044 08/23/14 0400  Weight: 80.332 kg (177 lb 1.6 oz) 80.332 kg (177 lb 1.6 oz)    Exam:   General:  She is in no acute distress, awake and alert  oriented  Cardiovascular: Regular rate and rhythm normal S1-S2  Respiratory: Clear to auscultation bilaterally no wheezing rhonchi or rales  Abdomen: Soft nontender nondistended  Musculoskeletal: No edema  Data Reviewed: Basic Metabolic Panel:  Recent Labs Lab 08/22/14 1803 08/23/14 0012 08/23/14 0517  NA 143  --  138  K 4.1  --  3.7  CL 105  --  107  CO2 25  --  17*  GLUCOSE 105*  --  128*  BUN 11  --  10  CREATININE 0.61 0.58 0.55  CALCIUM 9.2  --  8.5  MG  --   --  1.6   Liver Function Tests:  Recent Labs Lab 08/22/14 1803 08/23/14 0517  AST 26 21  ALT 41* 32  ALKPHOS 85 73  BILITOT 0.2* 0.3  PROT 7.1 6.6  ALBUMIN 3.6 3.1*   No results found for this basename: LIPASE, AMYLASE,  in the last 168 hours No results found for this basename: AMMONIA,  in the last 168 hours CBC:  Recent Labs Lab 08/22/14 1803 08/23/14 0012 08/23/14 0517  WBC 7.0 9.6 8.7  NEUTROABS 3.9  --  4.2  HGB 13.9 14.7 13.3  HCT 39.6 41.5 39.1  MCV 95.7 96.3 98.7  PLT 200 207 187   Cardiac Enzymes:  Recent Labs Lab 08/22/14 1803 08/23/14 0012 08/23/14 0517 08/23/14 1057  TROPONINI <0.30 <0.30 <0.30 <0.30   BNP (last 3 results) No results found for this basename: PROBNP,  in the last 8760 hours CBG: No results found for this basename: GLUCAP,  in the last 168 hours  No results found for this or any previous visit (from the past 240 hour(s)).   Studies: Ct Head  Wo Contrast  08/22/2014   CLINICAL DATA:  Dizziness.  Syncope.  EXAM: CT HEAD WITHOUT CONTRAST  TECHNIQUE: Contiguous axial images were obtained from the base of the skull through the vertex without intravenous contrast.  COMPARISON:  MRI 09/18/2009.  FINDINGS: No intra-axial or extra-axial pathologic shoulder blood collection. No mass lesion. No hydrocephalus. No hemorrhage. No acute bony abnormality. Visualized paranasal sinuses and mastoids are clear.  IMPRESSION: No acute abnormality.   Electronically Signed   By:  Maisie Fus  Register   On: 08/22/2014 18:28   Dg Chest Portable 1 View  08/22/2014   CLINICAL DATA:  DIZZINESS LOSS OF CONSCIOUSNESS  EXAM: PORTABLE CHEST - 1 VIEW  COMPARISON:  06/12/2013  FINDINGS: Mild subsegmental atelectasis or scarring at the left lung base, stable. Lungs otherwise clear. Heart size and mediastinal contours are within normal limits. No effusion. Visualized skeletal structures are unremarkable.  IMPRESSION: No acute cardiopulmonary disease.   Electronically Signed   By: Oley Balm M.D.   On: 08/22/2014 21:15    Scheduled Meds: . aspirin EC  81 mg Oral Daily  . enoxaparin (LOVENOX) injection  40 mg Subcutaneous Q24H  . nicotine  21 mg Transdermal Daily   Continuous Infusions: . sodium chloride 100 mL/hr at 08/23/14 1559    Active Problems:   Syncope and collapse   HTN (hypertension)   Nicotine abuse    Time spent: 25 min    Jeralyn Bennett  [facility] Pager [phone]. If 7PM-7AM, please contact night-coverage at [email or web address], password [facility] 08/23/2014, 5:40 PM  LOS: 1 day

## 2014-08-24 DIAGNOSIS — I369 Nonrheumatic tricuspid valve disorder, unspecified: Secondary | ICD-10-CM

## 2014-08-24 DIAGNOSIS — Z8673 Personal history of transient ischemic attack (TIA), and cerebral infarction without residual deficits: Secondary | ICD-10-CM

## 2014-08-24 LAB — CBC WITH DIFFERENTIAL/PLATELET
BASOS PCT: 0 % (ref 0–1)
Basophils Absolute: 0 10*3/uL (ref 0.0–0.1)
EOS ABS: 0.3 10*3/uL (ref 0.0–0.7)
Eosinophils Relative: 4 % (ref 0–5)
HEMATOCRIT: 38 % (ref 36.0–46.0)
HEMOGLOBIN: 13.1 g/dL (ref 12.0–15.0)
Lymphocytes Relative: 47 % — ABNORMAL HIGH (ref 12–46)
Lymphs Abs: 3.3 10*3/uL (ref 0.7–4.0)
MCH: 33.2 pg (ref 26.0–34.0)
MCHC: 34.5 g/dL (ref 30.0–36.0)
MCV: 96.2 fL (ref 78.0–100.0)
MONO ABS: 0.3 10*3/uL (ref 0.1–1.0)
MONOS PCT: 5 % (ref 3–12)
Neutro Abs: 3 10*3/uL (ref 1.7–7.7)
Neutrophils Relative %: 44 % (ref 43–77)
Platelets: 186 10*3/uL (ref 150–400)
RBC: 3.95 MIL/uL (ref 3.87–5.11)
RDW: 12.7 % (ref 11.5–15.5)
WBC: 7 10*3/uL (ref 4.0–10.5)

## 2014-08-24 LAB — URINE CULTURE: Colony Count: 15000

## 2014-08-24 LAB — COMPREHENSIVE METABOLIC PANEL
ALBUMIN: 3 g/dL — AB (ref 3.5–5.2)
ALT: 26 U/L (ref 0–35)
ANION GAP: 10 (ref 5–15)
AST: 16 U/L (ref 0–37)
Alkaline Phosphatase: 69 U/L (ref 39–117)
BILIRUBIN TOTAL: 0.3 mg/dL (ref 0.3–1.2)
BUN: 10 mg/dL (ref 6–23)
CHLORIDE: 111 meq/L (ref 96–112)
CO2: 22 mEq/L (ref 19–32)
Calcium: 8.5 mg/dL (ref 8.4–10.5)
Creatinine, Ser: 0.61 mg/dL (ref 0.50–1.10)
GFR calc Af Amer: >90 mL/min (ref >90)
GFR calc non Af Amer: >90 mL/min (ref >90)
Glucose, Bld: 89 mg/dL (ref 70–99)
POTASSIUM: 3.8 meq/L (ref 3.7–5.3)
Sodium: 143 mEq/L (ref 137–147)
Total Protein: 6.3 g/dL (ref 6.0–8.3)

## 2014-08-24 LAB — MAGNESIUM: Magnesium: 1.8 mg/dL (ref 1.5–2.5)

## 2014-08-24 MED ORDER — ASPIRIN 81 MG PO TBEC: 81.0000 mg | DELAYED_RELEASE_TABLET | Freq: Every day | ORAL | Status: AC

## 2014-08-24 MED ORDER — SIMVASTATIN 20 MG PO TABS: 20.0000 mg | ORAL_TABLET | Freq: Every day | ORAL | Status: DC

## 2014-08-24 MED ORDER — STROKE: EARLY STAGES OF RECOVERY BOOK
Freq: Once | Status: AC
  Administered 2014-08-24: 10:00:00
  Filled 2014-08-24: qty 1

## 2014-08-24 NOTE — Progress Notes (Signed)
Echocardiogram 2D Echocardiogram has been performed.  Lynn Pratt 08/24/2014, 12:35 PM

## 2014-08-24 NOTE — Progress Notes (Signed)
Pt being discharged home with family.  Reviewed discharge instructions and education, all questions answered.  Assessment unchanged from earlier.

## 2014-08-24 NOTE — Discharge Summary (Signed)
Physician Discharge Summary  Lynn Pratt NWG:[id number] DOB: January 04, 1961 DOA: 08/22/2014  PCP: Sid Falcon, MD  Admit date: 08/22/2014 Discharge date: 08/24/2014  Time spent: 40 minutes  Recommendations for Outpatient Follow-up:  1. Followup with primary care physician within one week.  Discharge Diagnoses:  Principal Problem:   Syncope and collapse Active Problems:   HTN (hypertension)   Nicotine abuse   History of CVA (cerebrovascular accident)   Discharge Condition: Stable  Diet recommendation: Heart healthy  Filed Weights   08/23/14 0044 08/23/14 0400 08/24/14 0500  Weight: 80.332 kg (177 lb 1.6 oz) 80.332 kg (177 lb 1.6 oz) 84.097 kg (185 lb 6.4 oz)    History of present illness:  Lynn Pratt is a 52 y.o. female BF PMHx HTN, HLD, nicotine abuse  Received pt from home with c/o Dizziness and syncopal episode lasting about 1 minute. States was sitting on couch watching te syncope vision experience vertigo, and then passed out. Husband states he came in to room found her passed out on the couch placed her on the floor and called 911. Negative loss of bowel or bladder control, when aroused initially was not able to answer questions appropriately. Patient states the next thing she remembers is lying on the floor with her husband standing over her. States negative previous episode, negative MI negative CVA. sees Pt c/o nausea but currently relieved. Pt has generalized weakness. Initial pulse ox for EMS was 91-92% on room air. Pt given 2 L O2 by EMS pulse ox increased to 95-96%. States has had a similar episode when she went to the restroom here in the ED; vertigo without passing out. UDS negative   Hospital Course:   Syncope -Suspect vasovagal etiology -Workup thus far has revealed 3 negative troponins, normal TSH, normal chemistries  -Transthoracic echocardiogram done and results are pending. -Patient reporting feeling better, she is ambulating without any  difficulties. -Patient to followup with primary care physician, if has more symptoms with the cardiology referral.  History of CVA -MRI showed remote lacunar infarct involving the left thalamus as incidental finding. -Patient discharged on simvastatin and low-dose aspirin.  Hypertension.  -Hydrochlorothiazide held given possibility of dehydration/hypotension  -On discharge lisinopril and hydrochlorothiazide restarted.  Tobacco abuse -Patient oxygen saturation was in the low 90s, patient reported that she smokes one pack per day for the past 30 years. -Counseled extensively about quit smoking, patient said she will quit smoking. -She will use OTC nicotine patches.   Procedures:  None  Consultations:  None  Discharge Exam: Filed Vitals:   08/24/14 0729  BP: 132/43  Pulse: 60  Temp: 97.8 F (36.6 C)  Resp: 17   General: Alert and awake, oriented x3, not in any acute distress. HEENT: anicteric sclera, pupils reactive to light and accommodation, EOMI CVS: S1-S2 clear, no murmur rubs or gallops Chest: clear to auscultation bilaterally, no wheezing, rales or rhonchi Abdomen: soft nontender, nondistended, normal bowel sounds, no organomegaly Extremities: no cyanosis, clubbing or edema noted bilaterally Neuro: Cranial nerves II-XII intact, no focal neurological deficits  Discharge Instructions You were cared for by a hospitalist during your hospital stay. If you have any questions about your discharge medications or the care you received while you were in the hospital after you are discharged, you can call the unit and asked to speak with the hospitalist on call if the hospitalist that took care of you is not available. Once you are discharged, your primary care physician will handle any further medical issues.  Please note that NO REFILLS for any discharge medications will be authorized once you are discharged, as it is imperative that you return to your primary care physician (or  establish a relationship with a primary care physician if you do not have one) for your aftercare needs so that they can reassess your need for medications and monitor your lab values.  Discharge Instructions   Diet - low sodium heart healthy    Complete by:  As directed      Increase activity slowly    Complete by:  As directed             Medication List         aspirin 81 MG EC tablet  Take 1 tablet (81 mg total) by mouth daily.     losartan-hydrochlorothiazide 50-12.5 MG per tablet  Commonly known as:  HYZAAR  Take 1 tablet by mouth daily.     multivitamin with minerals Tabs tablet  Take 1 tablet by mouth daily.     simvastatin 20 MG tablet  Commonly known as:  ZOCOR  Take 1 tablet (20 mg total) by mouth daily.     VITAMIN B-12 PO  Take 1 tablet by mouth daily.     VITAMIN D PO  Take 1 tablet by mouth daily.       No Known Allergies    The results of significant diagnostics from this hospitalization (including imaging, microbiology, ancillary and laboratory) are listed below for reference.    Significant Diagnostic Studies: Ct Head Wo Contrast  08/22/2014   CLINICAL DATA:  Dizziness.  Syncope.  EXAM: CT HEAD WITHOUT CONTRAST  TECHNIQUE: Contiguous axial images were obtained from the base of the skull through the vertex without intravenous contrast.  COMPARISON:  MRI 09/18/2009.  FINDINGS: No intra-axial or extra-axial pathologic shoulder blood collection. No mass lesion. No hydrocephalus. No hemorrhage. No acute bony abnormality. Visualized paranasal sinuses and mastoids are clear.  IMPRESSION: No acute abnormality.   Electronically Signed   By: Maisie Fus  Register   On: 08/22/2014 18:28   Mr Brain Wo Contrast  08/23/2014   CLINICAL DATA:  Syncope  EXAM: MRI HEAD WITHOUT CONTRAST  TECHNIQUE: Multiplanar, multiecho pulse sequences of the brain and surrounding structures were obtained without intravenous contrast.  COMPARISON:  Prior CT from 08/22/2014  FINDINGS: Mild  diffuse prominence of the CSF containing spaces is compatible with generalized cerebral atrophy, mild for patient age. Mild patchy T2/FLAIR hyperintensity seen within the subcortical white matter of the bilateral frontal lobes, nonspecific, but of doubtful clinical significance. No mass lesion, midline shift, or extra-axial fluid collection. Ventricles are normal in size without evidence of hydrocephalus.  Remote lacunar infarct present within the left thalamus.  No diffusion-weighted signal abnormality is identified to suggest acute intracranial infarct. Gray-white matter differentiation is maintained. Normal flow voids are seen within the intracranial vasculature. No intracranial hemorrhage identified.  The cervicomedullary junction is normal. Pituitary gland is within normal limits. Pituitary stalk is midline. The globes and optic nerves demonstrate a normal appearance with normal signal intensity.  The bone marrow signal intensity is normal. Calvarium is intact. Visualized upper cervical spine is within normal limits.  Scalp soft tissues are unremarkable.  Paranasal sinuses are clear.  No mastoid effusion.  IMPRESSION: 1. No acute intracranial infarct or other abnormality identified. 2. Remote lacunar infarct within the left thalamus. 3. Mild cerebral atrophy for patient age.   Electronically Signed   By: Rise Mu M.D.   On: 08/23/2014  22:44   Dg Chest Portable 1 View  08/22/2014   CLINICAL DATA:  DIZZINESS LOSS OF CONSCIOUSNESS  EXAM: PORTABLE CHEST - 1 VIEW  COMPARISON:  06/12/2013  FINDINGS: Mild subsegmental atelectasis or scarring at the left lung base, stable. Lungs otherwise clear. Heart size and mediastinal contours are within normal limits. No effusion. Visualized skeletal structures are unremarkable.  IMPRESSION: No acute cardiopulmonary disease.   Electronically Signed   By: Oley Balm M.D.   On: 08/22/2014 21:15    Microbiology: Recent Results (from the past 240 hour(s))   URINE CULTURE     Status: None   Collection Time    08/22/14  9:40 PM      Result Value Ref Range Status   Specimen Description URINE, CLEAN CATCH   Final   Special Requests NONE   Final   Culture  Setup Time     Final   Value: 08/23/2014 09:00     Performed at [facility] Count     Final   Value: 15,000 COLONIES/ML     Performed at [facility]   Culture     Final   Value: Multiple bacterial morphotypes present, none predominant. Suggest appropriate recollection if clinically indicated.     Performed at [facility]   Report Status 08/24/2014 FINAL   Final  CULTURE, BLOOD (ROUTINE X 2)     Status: None   Collection Time    08/23/14 12:12 AM      Result Value Ref Range Status   Specimen Description BLOOD RIGHT HAND   Final   Special Requests BOTTLES DRAWN AEROBIC ONLY 5CC   Final   Culture  Setup Time     Final   Value: 08/23/2014 09:22     Performed at [facility]   Culture     Final   Value:        BLOOD CULTURE RECEIVED NO GROWTH TO DATE CULTURE WILL BE HELD FOR 5 DAYS BEFORE ISSUING A FINAL NEGATIVE REPORT     Performed at [facility]   Report Status PENDING   Incomplete  CULTURE, BLOOD (ROUTINE X 2)     Status: None   Collection Time    08/23/14 12:24 AM      Result Value Ref Range Status   Specimen Description BLOOD RIGHT FOREARM   Final   Special Requests BOTTLES DRAWN AEROBIC AND ANAEROBIC 5CC EA   Final   Culture  Setup Time     Final   Value: 08/23/2014 09:22     Performed at [facility]   Culture     Final   Value:        BLOOD CULTURE RECEIVED NO GROWTH TO DATE CULTURE WILL BE HELD FOR 5 DAYS BEFORE ISSUING A FINAL NEGATIVE REPORT     Performed at [facility]   Report Status PENDING   Incomplete     Labs: Basic Metabolic Panel:  Recent Labs Lab 08/22/14 1803 08/23/14 0012 08/23/14 0517 08/24/14 0412  NA 143  --  138 143  K 4.1  --  3.7 3.8  CL 105  --  107 111  CO2 25  --  17*  22  GLUCOSE 105*  --  128* 89  BUN 11  --  10 10  CREATININE 0.61 0.58 0.55 0.61  CALCIUM 9.2  --  8.5 8.5  MG  --   --  1.6 1.8   Liver Function  Tests:  Recent Labs Lab 08/22/14 1803 08/23/14 0517 08/24/14 0412  AST ALT 41* 32 26  ALKPHOS 85 73 69  BILITOT 0.2* 0.3 0.3  PROT 7.1 6.6 6.3  ALBUMIN 3.6 3.1* 3.0*   No results found for this basename: LIPASE, AMYLASE,  in the last 168 hours No results found for this basename: AMMONIA,  in the last 168 hours CBC:  Recent Labs Lab 08/22/14 1803 08/23/14 0012 08/23/14 0517 08/24/14 0412  WBC 7.0 9.6 8.7 7.0  NEUTROABS 3.9  --  4.2 3.0  HGB 13.9 14.7 13.3 13.1  HCT 39.6 41.5 39.1 38.0  MCV 95.7 96.3 98.7 96.2  PLT 200 207 187 186   Cardiac Enzymes:  Recent Labs Lab 08/22/14 1803 08/23/14 0012 08/23/14 0517 08/23/14 1057  TROPONINI <0.30 <0.30 <0.30 <0.30   BNP: BNP (last 3 results) No results found for this basename: PROBNP,  in the last 8760 hours CBG: No results found for this basename: GLUCAP,  in the last 168 hours     Signed:  Jania Steinke A  [facility] 08/24/2014, 1:25 PM

## 2014-08-29 LAB — CULTURE, BLOOD (ROUTINE X 2)
Culture: NO GROWTH
Culture: NO GROWTH

## 2015-10-27 ENCOUNTER — Encounter (HOSPITAL_COMMUNITY): Payer: Self-pay

## 2015-10-27 ENCOUNTER — Inpatient Hospital Stay (HOSPITAL_COMMUNITY)
Admission: AD | Admit: 2015-10-27 | Discharge: 2015-10-27 | Disposition: A | Discharge status: Home / Self Care | Payer: Self-pay | Source: Ambulatory Visit | Attending: Emergency Medicine | Admitting: Emergency Medicine

## 2015-10-27 ENCOUNTER — Inpatient Hospital Stay (HOSPITAL_COMMUNITY): Payer: Self-pay

## 2015-10-27 DIAGNOSIS — I10 Essential (primary) hypertension: Secondary | ICD-10-CM | POA: Insufficient documentation

## 2015-10-27 DIAGNOSIS — Z7982 Long term (current) use of aspirin: Secondary | ICD-10-CM | POA: Insufficient documentation

## 2015-10-27 DIAGNOSIS — R61 Generalized hyperhidrosis: Secondary | ICD-10-CM | POA: Insufficient documentation

## 2015-10-27 DIAGNOSIS — R42 Dizziness and giddiness: Secondary | ICD-10-CM | POA: Insufficient documentation

## 2015-10-27 DIAGNOSIS — R9431 Abnormal electrocardiogram [ECG] [EKG]: Secondary | ICD-10-CM

## 2015-10-27 DIAGNOSIS — R5383 Other fatigue: Secondary | ICD-10-CM | POA: Insufficient documentation

## 2015-10-27 DIAGNOSIS — Z720 Tobacco use: Secondary | ICD-10-CM | POA: Insufficient documentation

## 2015-10-27 DIAGNOSIS — R110 Nausea: Secondary | ICD-10-CM | POA: Insufficient documentation

## 2015-10-27 DIAGNOSIS — R079 Chest pain, unspecified: Secondary | ICD-10-CM

## 2015-10-27 DIAGNOSIS — R0789 Other chest pain: Secondary | ICD-10-CM

## 2015-10-27 DIAGNOSIS — R197 Diarrhea, unspecified: Secondary | ICD-10-CM

## 2015-10-27 DIAGNOSIS — R0602 Shortness of breath: Secondary | ICD-10-CM | POA: Insufficient documentation

## 2015-10-27 DIAGNOSIS — R55 Syncope and collapse: Secondary | ICD-10-CM

## 2015-10-27 DIAGNOSIS — R531 Weakness: Secondary | ICD-10-CM | POA: Insufficient documentation

## 2015-10-27 DIAGNOSIS — E7800 Pure hypercholesterolemia, unspecified: Secondary | ICD-10-CM | POA: Insufficient documentation

## 2015-10-27 LAB — BASIC METABOLIC PANEL
Anion gap: 8 (ref 5–15)
BUN: 9 mg/dL (ref 6–20)
CHLORIDE: 104 mmol/L (ref 101–111)
CO2: 26 mmol/L (ref 22–32)
Calcium: 10.1 mg/dL (ref 8.9–10.3)
Creatinine, Ser: 0.77 mg/dL (ref 0.44–1.00)
Glucose, Bld: 125 mg/dL — ABNORMAL HIGH (ref 65–99)
POTASSIUM: 3.1 mmol/L — AB (ref 3.5–5.1)
SODIUM: 138 mmol/L (ref 135–145)

## 2015-10-27 LAB — CBC
HEMATOCRIT: 39.7 % (ref 36.0–46.0)
Hemoglobin: 13.9 g/dL (ref 12.0–15.0)
MCH: 33.7 pg (ref 26.0–34.0)
MCHC: 35 g/dL (ref 30.0–36.0)
MCV: 96.1 fL (ref 78.0–100.0)
PLATELETS: 236 10*3/uL (ref 150–400)
RBC: 4.13 MIL/uL (ref 3.87–5.11)
RDW: 12.9 % (ref 11.5–15.5)
WBC: 11.2 10*3/uL — AB (ref 4.0–10.5)

## 2015-10-27 LAB — TROPONIN I: Troponin I: <0.03 ng/mL (ref <0.031)

## 2015-10-27 LAB — GLUCOSE, CAPILLARY: Glucose-Capillary: 115 mg/dL — ABNORMAL HIGH (ref 65–99)

## 2015-10-27 MED ORDER — ASPIRIN 81 MG PO CHEW
324.0000 mg | CHEWABLE_TABLET | Freq: Once | ORAL | Status: AC
  Administered 2015-10-27: 324 mg via ORAL
  Filled 2015-10-27: qty 4

## 2015-10-27 MED ORDER — SODIUM CHLORIDE 0.9 % IV SOLN
999.0000 mL | INTRAVENOUS | Status: DC
  Administered 2015-10-27: 999 mL via INTRAVENOUS

## 2015-10-27 NOTE — ED Notes (Signed)
Pt arrived to ED placed on monitor. Denies CP at present.

## 2015-10-27 NOTE — MAU Note (Signed)
Pt presents complaining of feeling dizzy and flush. Was visiting daughter in hospital when this happened. Hx of hypertension and on meds but can't remember which ones. States she was admitted in September to hospital for syncope but can't remember what she was diagnosed with.

## 2015-10-27 NOTE — MAU Provider Note (Signed)
Chief Complaint: Dizziness   First Provider Initiated Contact with Patient 10/27/15 1739      SUBJECTIVE HPI: Lynn Pratt is a 54 y.o.  who presents to maternity admissions reporting near syncope, feeling hot and flushed, and nausea.  She also reports pain on the left side of her chest, below her left breast, near her sternum and radiating out to her left upper chest wall.  She also reports diarrhea episodes x 2, the second was uncontrollable, occuring while standing in MAU.  [facility] her daughter who is admitted on [facility] to have a baby.  She had an episode of nausea with diarrhea in the hospital, then walked out to her car and started to feel dizzy and reports having near syncope outside. She walked in to MAU for evaluation.  [facility] for syncopal episode and had full workup, which continued with her primary care provider after discharge, with no conclusive findings per the pt.  She has hx significant for HTN and high cholesterol and is taking her medications for these as prescribed. She usually takes a baby aspirin but did not take one today.  She reports eating and drinking regularly today. She denies abdominal pain, vaginal bleeding, vaginal itching/burning, urinary symptoms, h/a, or fever/chills.     Near Syncope This is a recurrent problem. The current episode started today. The problem occurs rarely. The problem has been waxing and waning. Associated symptoms include a change in bowel habit, chest pain, diaphoresis, fatigue, nausea and weakness. Pertinent negatives include no abdominal pain, chills, congestion, coughing, fever, headaches, neck pain, numbness, sore throat, swollen glands, urinary symptoms, vertigo, visual change or vomiting. The symptoms are aggravated by walking, standing and exertion. She has tried nothing for the symptoms.  Chest Pain  This is a recurrent problem. The current episode started today. The onset  quality is sudden. The problem occurs constantly. The problem has been unchanged. The pain is present in the lateral region. The pain is moderate. The quality of the pain is described as pressure and sharp. The pain does not radiate. Associated symptoms include diaphoresis, dizziness, nausea, near-syncope, shortness of breath and weakness. Pertinent negatives include no abdominal pain, cough, exertional chest pressure, fever, headaches, irregular heartbeat, leg pain, lower extremity edema, numbness, palpitations, sputum production, syncope or vomiting. The pain is aggravated by nothing. She has tried nothing for the symptoms. Risk factors include post-menopausal, smoking/tobacco exposure and stress.  Her past medical history is significant for hyperlipidemia and hypertension.  Diarrhea  This is a new problem. The current episode started today. The problem occurs less than 2 times per day. The problem has been gradually worsening. The stool consistency is described as watery. Pertinent negatives include no abdominal pain, chills, coughing, fever, headaches or vomiting. The symptoms are aggravated by stress. There are no known risk factors. She has tried nothing for the symptoms.    Past Medical History  Diagnosis Date  . Hypertension   . High cholesterol    Past Surgical History  Procedure Laterality Date  . Cholecystectomy    . Knee surgery    . Shoulder surgery    . Abdominal hysterectomy     Social History   Social History  . Marital Status: Married    Spouse Name: N/A  . Number of Children: N/A  . Years of Education: N/A   Occupational History  . Not on file.   Social History Main Topics  . Smoking status:  Current Every Day Smoker -- 1.00 packs/day  . Smokeless tobacco: Not on file  . Alcohol Use: No  . Drug Use: No  . Sexual Activity: Not on file   Other Topics Concern  . Not on file   Social History Narrative   No current facility-administered medications on file prior  to encounter.   Current Outpatient Prescriptions on File Prior to Encounter  Medication Sig Dispense Refill  . aspirin EC 81 MG EC tablet Take 1 tablet (81 mg total) by mouth daily.    . Cyanocobalamin (VITAMIN B-12 PO) Take 1 tablet by mouth daily.    Marland Kitchen losartan-hydrochlorothiazide (HYZAAR) 50-12.5 MG per tablet Take 1 tablet by mouth daily.    . Multiple Vitamin (MULTIVITAMIN WITH MINERALS) TABS tablet Take 1 tablet by mouth daily.    . simvastatin (ZOCOR) 20 MG tablet Take 1 tablet (20 mg total) by mouth daily. 30 tablet 2   No Known Allergies  ROS:  Review of Systems  Constitutional: Positive for diaphoresis and fatigue. Negative for fever and chills.  HENT: Negative for congestion, sinus pressure and sore throat.   Eyes: Negative for photophobia.  Respiratory: Positive for shortness of breath. Negative for cough and sputum production.   Cardiovascular: Positive for chest pain and near-syncope. Negative for palpitations and syncope.  Gastrointestinal: Positive for nausea, diarrhea and change in bowel habit. Negative for vomiting, abdominal pain and constipation.  Genitourinary: Negative for dysuria, frequency, flank pain, vaginal bleeding, vaginal discharge, difficulty urinating, vaginal pain and pelvic pain.  Musculoskeletal: Negative for neck pain.  Neurological: Positive for dizziness and weakness. Negative for vertigo, numbness and headaches.  Psychiatric/Behavioral: Negative.      I have reviewed patient's Past Medical Hx, Surgical Hx, Family Hx, Social Hx, medications and allergies.   Physical Exam   Patient Vitals for the past 24 hrs:  BP Temp Temp src Pulse Resp SpO2  10/27/15 1900 112/59 mmHg 97.6 F (36.4 C) Oral 79 18 95 %  10/27/15 1828 - - - 82 - 92 %  10/27/15 1827 113/66 mmHg 98.3 F (36.8 C) - 86 16 93 %  10/27/15 1826 - - - 83 - -  10/27/15 1823 - - - 83 - 95 %  10/27/15 1818 - - - 88 - 93 %  10/27/15 1758 - - - 92 - 96 %  10/27/15 1757 125/64 mmHg - -  93 - -  10/27/15 1728 106/56 mmHg 98 F (36.7 C) Oral 100 18 (!) 89 %   Constitutional: Well-developed, well-nourished female in mild distress.  HEART: normal rate, heart sounds, regular rhythm RESP: normal effort, lung sounds clear and equal bilaterally GI: Abd soft, non-tender. Pos BS x 4 MS: Extremities nontender, no edema, normal ROM Neurologic: Alert and oriented x 4.  GU: Neg CVAT.   LAB RESULTS Results for orders placed or performed during the hospital encounter of 10/27/15 (from the past 24 hour(s))  Glucose, capillary     Status: Abnormal   Collection Time: 10/27/15  5:26 PM  Result Value Ref Range   Glucose-Capillary 115 (H) 65 - 99 mg/dL  Basic metabolic panel     Status: Abnormal   Collection Time: 10/27/15  5:50 PM  Result Value Ref Range   Sodium 138 135 - 145 mmol/L   Potassium 3.1 (L) 3.5 - 5.1 mmol/L   Chloride 104 101 - 111 mmol/L   CO2 26 22 - 32 mmol/L   Glucose, Bld 125 (H) 65 - 99 mg/dL   BUN  9 6 - 20 mg/dL   Creatinine, Ser 1.61 0.44 - 1.00 mg/dL   Calcium 09.6 8.9 - 04.5 mg/dL   GFR calc non Af Amer >60 >60 mL/min   GFR calc Af Amer >60 >60 mL/min   Anion gap 8 5 - 15  CBC     Status: Abnormal   Collection Time: 10/27/15  5:50 PM  Result Value Ref Range   WBC 11.2 (H) 4.0 - 10.5 K/uL   RBC 4.13 3.87 - 5.11 MIL/uL   Hemoglobin 13.9 12.0 - 15.0 g/dL   HCT 40.9 81.1 - 91.4 %   MCV 96.1 78.0 - 100.0 fL   MCH 33.7 26.0 - 34.0 pg   MCHC 35.0 30.0 - 36.0 g/dL   RDW 78.2 95.6 - 21.3 %   Platelets 236 150 - 400 K/uL       IMAGING No results found.  MAU Management/MDM: Pt brought to MAU room quickly after check in at the front desk.  CNM called to room.  O2 applied 2L via , IV started with NS at 125, continuous pulse ox applied. EKG performed. Labs drawn by RN for IV start so labs ordered by CNM.  Called ED physician Linwood Dibbles, who accepted transfer to [facility] ED for further evaluation.  Pt given chewable aspirin 325 mg while in MAU.  Pt stable at  time of transfer.  ASSESSMENT 1. Near syncope   2. Chest pain of uncertain etiology   3. Diarrhea, unspecified type   4. Abnormal finding on EKG     PLAN Transfer to [facility] for further evaluation     Medication List    ASK your doctor about these medications        aspirin 81 MG EC tablet  Take 1 tablet (81 mg total) by mouth daily.     losartan-hydrochlorothiazide 50-12.5 MG tablet  Commonly known as:  HYZAAR  Take 1 tablet by mouth daily.     multivitamin with minerals Tabs tablet  Take 1 tablet by mouth daily.     simvastatin 20 MG tablet  Commonly known as:  ZOCOR  Take 1 tablet (20 mg total) by mouth daily.     VITAMIN B-12 PO  Take 1 tablet by mouth daily.         Sharen Counter Certified Nurse-Midwife 10/27/2015  7:08 PM

## 2015-10-27 NOTE — ED Provider Notes (Signed)
CSN: [id number]     Arrival date & time 10/27/15  1723 History   First MD Initiated Contact with Patient 10/27/15 1902     Chief Complaint  Patient presents with  . Dizziness  . Chest Pain   Patient is a 54 y.o. female presenting with dizziness and chest pain.  Dizziness Associated symptoms: chest pain   Chest Pain Associated symptoms: dizziness    [facility]  I spoke with the patient and confirmed that the information is accurate.  Per their notes  "HPI: Lynn Pratt is a 54 y.o. who presents to maternity admissions reporting near syncope, feeling hot and flushed, and nausea. She also reports pain on the left side of her chest, below her left breast, near her sternum and radiating out to her left upper chest wall. She also reports diarrhea episodes x 2, the second was uncontrollable, occuring while standing in MAU. [facility] visiting her daughter who is admitted on [facility] to have a baby. She had an episode of nausea with diarrhea in the hospital, then walked out to her car and started to feel dizzy and reports having near syncope outside. She walked in to MAU for evaluation. [facility] for syncopal episode and had full workup, which continued with her primary care provider after discharge, with no conclusive findings per the pt. She has hx significant for HTN and high cholesterol and is taking her medications for these as prescribed. She usually takes a baby aspirin but did not take one today. She reports eating and drinking regularly today. She denies abdominal pain, vaginal bleeding, vaginal itching/burning, urinary symptoms, h/a, or fever/chills" She did have some some chest pain earlier during the event.  Sharp on the left side, lased a few minutes.  She did feel clammy and short of breath. She has no history of prior heart disease.   Past Medical History  Diagnosis Date  . Hypertension   .  High cholesterol    Past Surgical History  Procedure Laterality Date  . Cholecystectomy    . Knee surgery    . Shoulder surgery    . Abdominal hysterectomy     History reviewed. No pertinent family history. Social History  Substance Use Topics  . Smoking status: Current Every Day Smoker -- 1.00 packs/day  . Smokeless tobacco: None  . Alcohol Use: No   OB History    No data available     Review of Systems  Cardiovascular: Positive for chest pain.  Neurological: Positive for dizziness.  All other systems reviewed and are negative.     Allergies  Review of patient's allergies indicates no known allergies.  Home Medications   Prior to Admission medications   Medication Sig Start Date End Date Taking? Authorizing Provider  aspirin EC 81 MG EC tablet Take 1 tablet (81 mg total) by mouth daily. 08/24/14  Yes Clydia LlanoMutaz Elmahi, MD  Cyanocobalamin (VITAMIN B-12 PO) Take 1 tablet by mouth daily.   Yes Historical Provider, MD  losartan-hydrochlorothiazide (HYZAAR) 50-12.5 MG per tablet Take 1 tablet by mouth daily.   Yes Historical Provider, MD  Multiple Vitamin (MULTIVITAMIN WITH MINERALS) TABS tablet Take 1 tablet by mouth daily.   Yes Historical Provider, MD  simvastatin (ZOCOR) 20 MG tablet Take 1 tablet (20 mg total) by mouth daily. 08/24/14  Yes Mutaz Elmahi, MD   BP 134/72 mmHg  Pulse 74  Temp(Src) 97.6 F (36.4  C) (Oral)  Resp 21  SpO2 93% Physical Exam  Constitutional: She appears well-developed and well-nourished. No distress.  HENT:  Head: Normocephalic and atraumatic.  Right Ear: External ear normal.  Left Ear: External ear normal.  Eyes: Conjunctivae are normal. Right eye exhibits no discharge. Left eye exhibits no discharge. No scleral icterus.  Neck: Neck supple. No tracheal deviation present.  Cardiovascular: Normal rate, regular rhythm and intact distal pulses.   Pulmonary/Chest: Effort normal and breath sounds normal. No stridor. No respiratory distress. She has  no wheezes. She has no rales.  Abdominal: Soft. Bowel sounds are normal. She exhibits no distension. There is no tenderness. There is no rebound and no guarding.  Musculoskeletal: She exhibits no edema or tenderness.  Neurological: She is alert. She has normal strength. No cranial nerve deficit (no facial droop, extraocular movements intact, no slurred speech) or sensory deficit. She exhibits normal muscle tone. She displays no seizure activity. Coordination normal.  Skin: Skin is warm and dry. No rash noted.  Psychiatric: She has a normal mood and affect.  Nursing note and vitals reviewed.   ED Course  Procedures (including critical care time) Labs Review Labs Reviewed  GLUCOSE, CAPILLARY - Abnormal; Notable for the following:    Glucose-Capillary 115 (*)    All other components within normal limits  BASIC METABOLIC PANEL - Abnormal; Notable for the following:    Potassium 3.1 (*)    Glucose, Bld 125 (*)    All other components within normal limits  CBC - Abnormal; Notable for the following:    WBC 11.2 (*)    All other components within normal limits  TROPONIN I  TROPONIN I    Imaging Review Dg Chest 2 View  10/27/2015  CLINICAL DATA:  54 year old female with acute chest pain and shortness of breath today. EXAM: CHEST  2 VIEW COMPARISON:  08/22/2014 and 06/12/2013 radiographs FINDINGS: The cardiomediastinal silhouette is unremarkable. Mild bibasilar atelectasis/ scarring again noted. There is no evidence of focal airspace disease, pulmonary edema, suspicious pulmonary nodule/mass, pleural effusion, or pneumothorax. No acute bony abnormalities are identified. IMPRESSION: No evidence of acute cardiopulmonary disease. Mild bibasilar atelectasis/scarring again noted. Electronically Signed   By: Harmon Pier M.D.   On: 10/27/2015 19:47   I have personally reviewed and evaluated these images and lab results as part of my medical decision-making.   EKG Interpretation   Date/Time:   Thursday October 27 2015 18:56:48 EDT Ventricular Rate:  74 PR Interval:  192 QRS Duration: 94 QT Interval:  417 QTC Calculation: 463 R Axis:   76 Text Interpretation:  Sinus rhythm No old tracing to compare Confirmed by  Indy Kuck  MD-J, Henlee Donovan [phone]) on 10/27/2015 7:05:12 PM      MDM   Final diagnoses:  Near syncope  Chest pain of uncertain etiology  Diarrhea, unspecified type  Abnormal finding on EKG    Pt presented to the ED for further evaluation after an initial evaluation at [facility]. Patient experienced brief episodes of sharp pain in her chest associated with dizziness. She also had some intermittent diarrhea.  Atypical for cardiac disease.  2 sets of cardiac enzymes are normal.  Reassuring ECG.  Pt is feeling better and wants to go home.  She will follow up with her PCP    Linwood Dibbles, MD 10/27/15 2150

## 2015-10-27 NOTE — ED Notes (Signed)
Per CARELINK- pt at MAU with daughter when she began having chest pain and dizziness. Chest pain has subsided.

## 2015-10-27 NOTE — ED Notes (Signed)
Patient transported to X-ray 

## 2015-10-27 NOTE — MAU Note (Signed)
2 liters O2 nasal canula applied after EKG. O2 sats up to 95-97%

## 2015-10-27 NOTE — Discharge Instructions (Signed)

## 2016-06-01 ENCOUNTER — Ambulatory Visit: Payer: Self-pay

## 2016-06-01 ENCOUNTER — Other Ambulatory Visit: Payer: Self-pay | Admitting: Occupational Medicine

## 2016-06-01 DIAGNOSIS — M25511 Pain in right shoulder: Secondary | ICD-10-CM

## 2016-07-02 ENCOUNTER — Other Ambulatory Visit: Payer: Self-pay | Admitting: Orthopedic Surgery

## 2016-07-02 DIAGNOSIS — M533 Sacrococcygeal disorders, not elsewhere classified: Secondary | ICD-10-CM

## 2016-07-13 ENCOUNTER — Other Ambulatory Visit: Payer: Self-pay

## 2016-08-23 ENCOUNTER — Other Ambulatory Visit: Payer: Self-pay

## 2016-10-31 ENCOUNTER — Emergency Department (HOSPITAL_COMMUNITY)
Admission: EM | Admit: 2016-10-31 | Discharge: 2016-10-31 | Disposition: A | Discharge status: Home / Self Care | Payer: BLUE CROSS/BLUE SHIELD | Attending: Emergency Medicine | Admitting: Emergency Medicine

## 2016-10-31 ENCOUNTER — Encounter (HOSPITAL_COMMUNITY): Payer: Self-pay | Admitting: Emergency Medicine

## 2016-10-31 ENCOUNTER — Emergency Department (HOSPITAL_COMMUNITY): Payer: BLUE CROSS/BLUE SHIELD

## 2016-10-31 DIAGNOSIS — I10 Essential (primary) hypertension: Secondary | ICD-10-CM | POA: Insufficient documentation

## 2016-10-31 DIAGNOSIS — Z7982 Long term (current) use of aspirin: Secondary | ICD-10-CM | POA: Diagnosis not present

## 2016-10-31 DIAGNOSIS — I809 Phlebitis and thrombophlebitis of unspecified site: Secondary | ICD-10-CM

## 2016-10-31 DIAGNOSIS — I808 Phlebitis and thrombophlebitis of other sites: Secondary | ICD-10-CM | POA: Diagnosis not present

## 2016-10-31 DIAGNOSIS — F17200 Nicotine dependence, unspecified, uncomplicated: Secondary | ICD-10-CM | POA: Insufficient documentation

## 2016-10-31 DIAGNOSIS — R0789 Other chest pain: Secondary | ICD-10-CM | POA: Diagnosis present

## 2016-10-31 LAB — BASIC METABOLIC PANEL
ANION GAP: 10 (ref 5–15)
BUN: 6 mg/dL (ref 6–20)
CHLORIDE: 104 mmol/L (ref 101–111)
CO2: 26 mmol/L (ref 22–32)
Calcium: 9.6 mg/dL (ref 8.9–10.3)
Creatinine, Ser: 0.64 mg/dL (ref 0.44–1.00)
Glucose, Bld: 93 mg/dL (ref 65–99)
POTASSIUM: 3.5 mmol/L (ref 3.5–5.1)
SODIUM: 140 mmol/L (ref 135–145)

## 2016-10-31 LAB — I-STAT TROPONIN, ED: Troponin i, poc: 0 ng/mL (ref 0.00–0.08)

## 2016-10-31 LAB — CBC
HEMATOCRIT: 43.1 % (ref 36.0–46.0)
Hemoglobin: 14.9 g/dL (ref 12.0–15.0)
MCH: 33 pg (ref 26.0–34.0)
MCHC: 34.6 g/dL (ref 30.0–36.0)
MCV: 95.6 fL (ref 78.0–100.0)
Platelets: 236 10*3/uL (ref 150–400)
RBC: 4.51 MIL/uL (ref 3.87–5.11)
RDW: 12.5 % (ref 11.5–15.5)
WBC: 9.4 10*3/uL (ref 4.0–10.5)

## 2016-10-31 NOTE — ED Notes (Signed)
Patient requesting to be discharged, requesting pulse ox and monitoring be removed.  Informed patient awaiting discharge papers.

## 2016-10-31 NOTE — ED Notes (Signed)
Patient awaiting results and discussion for discharge or admission.

## 2016-10-31 NOTE — ED Provider Notes (Signed)
  Face-to-face evaluation   History: Sheis sore area on her chest today. She went to a clinic advised her to come here to get checked for "clogged artery". He also told her that she had an abnormal EKG. She denies fever, chills, cough, shortness of breath, nausea, vomiting or syncope.  Physical exam: Alert, calm, cooperative. Tender cord left upper chest wall, which feels like a small thrombus in a superficial vein. No overlying erythema or skin changes, or palpable fluctuance. Left axilla appears normal.  Medical screening examination/treatment/procedure(s) were conducted as a shared visit with non-physician practitioner(s) and myself.  I personally evaluated the patient during the encounter   Mancel BaleElliott Jaryd Drew, MD 11/05/16 [phone]

## 2016-10-31 NOTE — ED Triage Notes (Signed)
Pt states she woke up with cord like pain across chest and in left arm. Palpable "cord" across chest under skin. Denies injury. Denies SOB/actual CP. Site is painful to the touch.

## 2016-10-31 NOTE — Discharge Instructions (Signed)
Use heat on the sore area 3 or 4 times a day.  Take ibuprofen 400 mg 3 times a day with meals for pain.  Return here if your symptoms worsen or you're not better in 5 days time.

## 2016-11-02 NOTE — ED Provider Notes (Signed)
[facility]-EMERGENCY DEPT Provider Note   CSN: [id number] Arrival date & time: 10/31/16  1702     History   Chief Complaint Chief Complaint  Patient presents with  . Chest Pain    painful "cordlike" area across chest    HPI Lynn Pratt is a 55 y.o. female who presents for tenderness of the Left chest wall. The patient noticed a tender, cord-like structure in the left chest wall.  She is worried she might have a "clogged artery," She denies internal cp, sob. Denies left arm swelling, heaviness or venous distention. She denies any recent IVs.  HPI  Past Medical History:  Diagnosis Date  . High cholesterol   . Hypertension     Patient Active Problem List   Diagnosis Date Noted  . History of CVA (cerebrovascular accident) 08/24/2014  . Syncope and collapse 08/22/2014  . HTN (hypertension) 08/22/2014  . Nicotine abuse 08/22/2014    Past Surgical History:  Procedure Laterality Date  . ABDOMINAL HYSTERECTOMY    . CHOLECYSTECTOMY    . KNEE SURGERY    . SHOULDER SURGERY      OB History    No data available       Home Medications    Prior to Admission medications   Medication Sig Start Date End Date Taking? Authorizing Provider  aspirin EC 81 MG EC tablet Take 1 tablet (81 mg total) by mouth daily. 08/24/14  Yes Clydia LlanoMutaz Elmahi, MD  losartan-hydrochlorothiazide (HYZAAR) 50-12.5 MG per tablet Take 1 tablet by mouth daily.   Yes Historical Provider, MD  simvastatin (ZOCOR) 20 MG tablet Take 1 tablet (20 mg total) by mouth daily. Patient not taking: Reported on 10/31/2016 08/24/14   Clydia LlanoMutaz Elmahi, MD    Family History History reviewed. No pertinent family history.  Social History Social History  Substance Use Topics  . Smoking status: Current Every Day Smoker    Packs/day: 1.00  . Smokeless tobacco: Never Used  . Alcohol use No     Allergies   Simvastatin   Review of Systems Review of Systems  Ten systems reviewed and are negative for acute change, except as  noted in the HPI.   Physical Exam Updated Vital Signs BP (!) 155/102   Pulse 67   Temp 98.2 F (36.8 C) (Oral)   Resp 20   Ht 5\' 6"  (1.676 m)   Wt 81.6 kg   SpO2 91%   BMI 29.05 kg/m   Physical Exam  Constitutional: She is oriented to person, place, and time. She appears well-developed and well-nourished. No distress.  HENT:  Head: Normocephalic and atraumatic.  Eyes: Conjunctivae are normal. No scleral icterus.  Neck: Normal range of motion.  Cardiovascular: Normal rate, regular rhythm and normal heart sounds.  Exam reveals no gallop and no friction rub.   No murmur heard. Pulmonary/Chest: Effort normal and breath sounds normal. No respiratory distress. She exhibits tenderness.  Tender cordlike structure in the left upper chest wall.  No venous dilation elsewhere. No left arm swelling. 2+ radial and brachial pulse on the left arm  Abdominal: Soft. Bowel sounds are normal. She exhibits no distension and no mass. There is no tenderness. There is no guarding.  Neurological: She is alert and oriented to person, place, and time.  Skin: Skin is warm and dry. She is not diaphoretic.     ED Treatments / Results  Labs (all labs ordered are listed, but only abnormal results are displayed) Labs Reviewed  BASIC METABOLIC PANEL  CBC  Rosezena SensorI-STAT TROPOININ, ED    EKG  EKG Interpretation  Date/Time:  Wednesday October 31 2016 17:08:30 EST Ventricular Rate:  69 PR Interval:  172 QRS Duration: 82 QT Interval:  432 QTC Calculation: 462 R Axis:   51 Text Interpretation:  Normal sinus rhythm Cannot rule out Anterior infarct , age undetermined Abnormal ECG since last tracing no significant change Confirmed by Effie ShyWENTZ  MD, ELLIOTT [phone]([id number]) on 10/31/2016 9:30:29 PM       Radiology Dg Chest 2 View  Result Date: 10/31/2016 CLINICAL DATA:  Left-sided chest and anterior shoulder pain for 2 days. EXAM: CHEST  2 VIEW COMPARISON:  10/27/2015 FINDINGS: The heart size and mediastinal contours are  within normal limits. There is minimal subsegmental atelectasis and/or scarring at bases. The visualized skeletal structures are unremarkable. IMPRESSION: No active cardiopulmonary disease.  Minimal bibasilar atelectasis. Electronically Signed   By: Tollie Ethavid  Kwon M.D.   On: 10/31/2016 17:51    Procedures Procedures (including critical care time)  Medications Ordered in ED Medications - No data to display   Initial Impression / Assessment and Plan / ED Course  I have reviewed the triage vital signs and the nursing notes.  Pertinent labs & imaging results that were available during my care of the patient were reviewed by me and considered in my medical decision making (see chart for details).  Clinical Course     Patient with superficial thrombophlebitis of the left chest wall. No si/sx of dvt. Patient will need to apply warm compresses.  Follow up with pcp. Patient seen in shared visit with attending physician. Who agrees with assessment, work up , treatment, and plan for discharge.    Final Clinical Impressions(s) / ED Diagnoses   Final diagnoses:  Superficial thrombophlebitis, involving unspecified site    New Prescriptions Discharge Medication List as of 10/31/2016 10:04 PM       Arthor CaptainAbigail Marios Gaiser, PA-C 11/02/16 1126    Mancel BaleElliott Wentz, MD 11/05/16 [phone]

## 2018-01-25 ENCOUNTER — Other Ambulatory Visit: Payer: Self-pay

## 2018-01-25 ENCOUNTER — Emergency Department (HOSPITAL_COMMUNITY)
Admission: EM | Admit: 2018-01-25 | Discharge: 2018-01-25 | Disposition: A | Discharge status: Home / Self Care | Payer: BLUE CROSS/BLUE SHIELD | Attending: Emergency Medicine | Admitting: Emergency Medicine

## 2018-01-25 ENCOUNTER — Encounter (HOSPITAL_COMMUNITY): Payer: Self-pay | Admitting: Emergency Medicine

## 2018-01-25 DIAGNOSIS — Z7982 Long term (current) use of aspirin: Secondary | ICD-10-CM | POA: Insufficient documentation

## 2018-01-25 DIAGNOSIS — L509 Urticaria, unspecified: Secondary | ICD-10-CM

## 2018-01-25 DIAGNOSIS — F17210 Nicotine dependence, cigarettes, uncomplicated: Secondary | ICD-10-CM | POA: Insufficient documentation

## 2018-01-25 DIAGNOSIS — I10 Essential (primary) hypertension: Secondary | ICD-10-CM | POA: Insufficient documentation

## 2018-01-25 DIAGNOSIS — L299 Pruritus, unspecified: Secondary | ICD-10-CM | POA: Insufficient documentation

## 2018-01-25 MED ORDER — FAMOTIDINE 20 MG PO TABS: ORAL_TABLET | ORAL | 0 refills | Status: AC

## 2018-01-25 MED ORDER — HYDROXYZINE HCL 25 MG PO TABS: 25.0000 mg | ORAL_TABLET | Freq: Four times a day (QID) | ORAL | 0 refills | Status: AC | PRN

## 2018-01-25 MED ORDER — DEXAMETHASONE SODIUM PHOSPHATE 10 MG/ML IJ SOLN
10.0000 mg | Freq: Once | INTRAMUSCULAR | Status: AC
  Administered 2018-01-25: 10 mg via INTRAMUSCULAR
  Filled 2018-01-25: qty 1

## 2018-01-25 MED ORDER — FAMOTIDINE 20 MG PO TABS
40.0000 mg | ORAL_TABLET | Freq: Once | ORAL | Status: AC
  Administered 2018-01-25: 40 mg via ORAL
  Filled 2018-01-25: qty 2

## 2018-01-25 MED ORDER — FAMOTIDINE 20 MG PO TABS: ORAL_TABLET | ORAL | 0 refills | Status: DC

## 2018-01-25 NOTE — ED Provider Notes (Signed)
[facility]-EMERGENCY DEPT Provider Note: Lowella Dell, MD, FACEP  CSN: [id number] MRN: [id number] ARRIVAL: 01/25/18 at 2226 ROOM: WA20/WA20   CHIEF COMPLAINT  Allergic Reaction   HISTORY OF PRESENT ILLNESS  01/25/18 10:58 PM Lynn Pratt is a 57 y.o. female who complains of hives for the past 4 days.  She has been at home and has not been venturing out.  She begins itching every afternoon about 3 PM which develops in the hives overnight.  The hives are located primarily on her upper trunk and neck.  They are intensely pruritic.  The hives resolve the next morning but her skin continues to be red.  She is also having some periorbital edema on the right side of her face.  She denies throat swelling, shortness of breath, nausea, vomiting or diarrhea.  She does not know what may be triggering this reaction.  She had a history of allergies about 20 years ago and underwent allergy testing and immunotherapy.  This represents a new return to the symptoms.  She has been taking Benadryl orally and topically without adequate relief.   Past Medical History:  Diagnosis Date  . High cholesterol   . Hypertension     Past Surgical History:  Procedure Laterality Date  . ABDOMINAL HYSTERECTOMY    . CHOLECYSTECTOMY    . KNEE SURGERY    . SHOULDER SURGERY      History reviewed. No pertinent family history.  Social History   Tobacco Use  . Smoking status: Current Every Day Smoker    Packs/day: 1.00  . Smokeless tobacco: Never Used  Substance Use Topics  . Alcohol use: No  . Drug use: No    Prior to Admission medications   Medication Sig Start Date End Date Taking? Authorizing Provider  aspirin EC 81 MG EC tablet Take 1 tablet (81 mg total) by mouth daily. 08/24/14   Clydia Llano, MD  losartan-hydrochlorothiazide (HYZAAR) 50-12.5 MG per tablet Take 1 tablet by mouth daily.    [provider]  simvastatin (ZOCOR) 20 MG tablet Take 1 tablet (20 mg total) by mouth daily. Patient not  taking: Reported on 10/31/2016 08/24/14   Clydia Llano, MD    Allergies Simvastatin   REVIEW OF SYSTEMS  Negative except as noted here or in the History of Present Illness.   PHYSICAL EXAMINATION  Initial Vital Signs Blood pressure (!) 143/79, pulse 89, temperature 98 F (36.7 C), temperature source Oral, resp. rate 18, SpO2 98 %.  Examination General: Well-developed, well-nourished female in no acute distress; appearance consistent with age of record HENT: normocephalic; atraumatic; mild right-sided periorbital edema; voice normal; no stridor Eyes: pupils equal, round and reactive to light; extraocular muscles intact Neck: supple Heart: regular rate and rhythm Lungs: clear to auscultation bilaterally Abdomen: soft; nondistended; nontender; bowel sounds present Extremities: No deformity; full range of motion; pulses normal Neurologic: Awake, alert and oriented; motor function intact in all extremities and symmetric; no facial droop Skin: Warm and dry; urticaria, most prominent on the upper chest and neck Psychiatric: Normal mood and affect   RESULTS  Summary of this visit's results, reviewed by myself:   EKG Interpretation  Date/Time:    Ventricular Rate:    PR Interval:    QRS Duration:   QT Interval:    QTC Calculation:   R Axis:     Text Interpretation:        Laboratory Studies: No results found for this or any previous visit (from the past 24  hour(s)). Imaging Studies: No results found.  ED COURSE  Nursing notes and initial vitals signs, including pulse oximetry, reviewed.  Vitals:   01/25/18 2231  BP: (!) 143/79  Pulse: 89  Resp: 18  Temp: 98 F (36.7 C)  TempSrc: Oral  SpO2: 98%   The patient was advised to recheck her house for any new potential allergens such as detergents, animals, mold or other items.  We will treat her symptomatically and refer her back to [facility] where she was previously tested and treated.  PROCEDURES     ED DIAGNOSES     ICD-10-CM   1. Urticaria L50.9        Berlie Persky, MD 01/25/18 2309

## 2018-01-25 NOTE — ED Triage Notes (Signed)
Pt states she is having an allergic reaction to something  Pt states for the past 3-4 days it starts every evening where she gets hives and itching  Pt states she has been using benadryl tablets and cream for the itching  States in the morning the hives are gone but she has redness then in the evening it starts again

## 2018-09-09 ENCOUNTER — Encounter (HOSPITAL_COMMUNITY): Payer: Self-pay

## 2018-09-09 ENCOUNTER — Ambulatory Visit (HOSPITAL_COMMUNITY)
Admission: EM | Admit: 2018-09-09 | Discharge: 2018-09-09 | Disposition: A | Discharge status: Home / Self Care | Payer: BLUE CROSS/BLUE SHIELD | Attending: Family Medicine | Admitting: Family Medicine

## 2018-09-09 ENCOUNTER — Ambulatory Visit (INDEPENDENT_AMBULATORY_CARE_PROVIDER_SITE_OTHER): Payer: BLUE CROSS/BLUE SHIELD

## 2018-09-09 ENCOUNTER — Other Ambulatory Visit: Payer: Self-pay

## 2018-09-09 DIAGNOSIS — J40 Bronchitis, not specified as acute or chronic: Secondary | ICD-10-CM | POA: Diagnosis not present

## 2018-09-09 MED ORDER — IPRATROPIUM-ALBUTEROL 0.5-2.5 (3) MG/3ML IN SOLN
3.0000 mL | Freq: Once | RESPIRATORY_TRACT | Status: AC
  Administered 2018-09-09: 3 mL via RESPIRATORY_TRACT

## 2018-09-09 MED ORDER — DM-GUAIFENESIN ER 30-600 MG PO TB12: 1.0000 | ORAL_TABLET | Freq: Two times a day (BID) | ORAL | 0 refills | Status: AC

## 2018-09-09 MED ORDER — ALBUTEROL SULFATE HFA 108 (90 BASE) MCG/ACT IN AERS: 1.0000 | INHALATION_SPRAY | Freq: Four times a day (QID) | RESPIRATORY_TRACT | 0 refills | Status: AC | PRN

## 2018-09-09 MED ORDER — IPRATROPIUM-ALBUTEROL 0.5-2.5 (3) MG/3ML IN SOLN
RESPIRATORY_TRACT | Status: AC
  Filled 2018-09-09: qty 3

## 2018-09-09 MED ORDER — PREDNISONE 10 MG (21) PO TBPK: ORAL_TABLET | ORAL | 0 refills | Status: DC

## 2018-09-09 NOTE — ED Triage Notes (Signed)
Pt states she has a very bad cough  X 1 week but worst the last 3 days. Runny nose as well

## 2018-09-09 NOTE — ED Provider Notes (Signed)
[facility]    CSN: [id number] Arrival date & time: 09/09/18  1500     History   Chief Complaint Chief Complaint  Patient presents with  . Cough    HPI Lynn Pratt is a 57 y.o. female.   Patient is a 57 year old female with past medical history of hypertension and high cholesterol.  She presents with 2 weeks of URI symptoms worsening over the last week.  She has had cough, congestion with severe cough over the last 3 days.  She has been coughing up thick mucous.  She has had body aches and chills without fever.  She does have a history of smoking.  She denies any history of asthma or COPD.     Past Medical History:  Diagnosis Date  . High cholesterol   . Hypertension     Patient Active Problem List   Diagnosis Date Noted  . History of CVA (cerebrovascular accident) 08/24/2014  . Syncope and collapse 08/22/2014  . HTN (hypertension) 08/22/2014  . Nicotine abuse 08/22/2014    Past Surgical History:  Procedure Laterality Date  . ABDOMINAL HYSTERECTOMY    . CHOLECYSTECTOMY    . KNEE SURGERY    . SHOULDER SURGERY      OB History   None      Home Medications    Prior to Admission medications   Medication Sig Start Date End Date Taking? Authorizing Provider  albuterol (PROVENTIL HFA;VENTOLIN HFA) 108 (90 Base) MCG/ACT inhaler Inhale 1-2 puffs into the lungs every 6 (six) hours as needed for wheezing or shortness of breath. 09/09/18   Dahlia Byes A, NP  aspirin EC 81 MG EC tablet Take 1 tablet (81 mg total) by mouth daily. 08/24/14   Clydia Llano, MD  dextromethorphan-guaiFENesin (MUCINEX DM) 30-600 MG 12hr tablet Take 1 tablet by mouth 2 (two) times daily. 09/09/18   Dahlia Byes A, NP  famotidine (PEPCID) 20 MG tablet Take 1 tablet twice daily as needed for hives. 01/25/18   Molpus, John, MD  hydrOXYzine (ATARAX/VISTARIL) 25 MG tablet Take 1-2 tablets (25-50 mg total) by mouth every 6 (six) hours as needed (for hives; may cause drowsiness). 01/25/18    Molpus, John, MD  losartan-hydrochlorothiazide (HYZAAR) 50-12.5 MG per tablet Take 1 tablet by mouth daily.    [provider]  predniSONE (STERAPRED UNI-PAK 21 TAB) 10 MG (21) TBPK tablet 6 tabs for 1 day, then 5 tabs for 1 das, then 4 tabs for 1 day, then 3 tabs for 1 day, 2 tabs for 1 day, then 1 tab for 1 day 09/09/18   Janace Aris, NP    Family History History reviewed. No pertinent family history.  Social History Social History   Tobacco Use  . Smoking status: Current Every Day Smoker    Packs/day: 1.00  . Smokeless tobacco: Never Used  Substance Use Topics  . Alcohol use: No  . Drug use: No     Allergies   Simvastatin   Review of Systems Review of Systems  Constitutional: Positive for activity change, appetite change, chills and fatigue.  HENT: Positive for congestion.   Respiratory: Positive for cough and chest tightness. Negative for shortness of breath.   Musculoskeletal: Negative for arthralgias and myalgias.  Skin: Negative for color change and rash.     Physical Exam Triage Vital Signs ED Triage Vitals  Enc Vitals Group     BP 09/09/18 1519 (!) 142/84     Pulse Rate 09/09/18 1519 90  Resp 09/09/18 1519 18     Temp 09/09/18 1519 98.4 F (36.9 C)     Temp Source 09/09/18 1519 Oral     SpO2 09/09/18 1519 99 %     Weight 09/09/18 1520 176 lb (79.8 kg)     Height --      Head Circumference --      Peak Flow --      Pain Score --      Pain Loc --      Pain Edu? --      Excl. in GC? --    No data found.  Updated Vital Signs BP (!) 142/84 (BP Location: Left Arm)   Pulse 90   Temp 98.4 F (36.9 C) (Oral)   Resp 18   Wt 176 lb (79.8 kg)   SpO2 99%   BMI 28.41 kg/m   Visual Acuity Right Eye Distance:   Left Eye Distance:   Bilateral Distance:    Right Eye Near:   Left Eye Near:    Bilateral Near:     Physical Exam  Constitutional: She is oriented to person, place, and time. She appears well-developed and well-nourished.    Very pleasant. Non toxic or ill appearing.     HENT:  Head: Normocephalic and atraumatic.  Right Ear: External ear normal.  Left Ear: External ear normal.  Bilateral TMs normal. Thick drainage in posterior oropharynx without erythema. No tonsillar swelling or exudates No adenopathy  Eyes: Conjunctivae are normal.  Neck: Normal range of motion.  Cardiovascular: Normal rate and regular rhythm.  Pulmonary/Chest: Effort normal.  Coarse lung sounds in all lung fields. No dyspnea  Musculoskeletal: Normal range of motion.  Neurological: She is alert and oriented to person, place, and time.  Skin: Skin is warm and dry.  Psychiatric: She has a normal mood and affect.  Nursing note and vitals reviewed.    UC Treatments / Results  Labs (all labs ordered are listed, but only abnormal results are displayed) Labs Reviewed - No data to display  EKG None  Radiology Dg Chest 2 View  Result Date: 09/09/2018 CLINICAL DATA:  Cough for 2 weeks EXAM: CHEST - 2 VIEW COMPARISON:  October 31, 2016 FINDINGS: There is slight scarring in the right base. Lungs elsewhere clear. Heart size and pulmonary vascular normal. No adenopathy. No evident bone lesions. IMPRESSION: Scarring right base. No edema or consolidation. Stable cardiac silhouette. Electronically Signed   By: Bretta BangWilliam  Woodruff III M.D.   On: 09/09/2018 15:41    Procedures Procedures (including critical care time)  Medications Ordered in UC Medications  ipratropium-albuterol (DUONEB) 0.5-2.5 (3) MG/3ML nebulizer solution 3 mL (3 mLs Nebulization Given 09/09/18 1552)    Initial Impression / Assessment and Plan / UC Course  I have reviewed the triage vital signs and the nursing notes.  Pertinent labs & imaging results that were available during my care of the patient were reviewed by me and considered in my medical decision making (see chart for details).     X-ray negative for pneumonia Most likely bronchitis DuoNeb given in  clinic Was sent home with albuterol inhaler, prednisone taper, cough/congestion medication. Instructed to follow-up if no improvement in the next couple days. Sooner if worse.  Patient agreeable to plan Final Clinical Impressions(s) / UC Diagnoses   Final diagnoses:  Bronchitis     Discharge Instructions     It was nice meeting you!!  Your x-ray was negative for pneumonia. We will go ahead and treat  you for bronchitis with steroids, cough medicine and an albuterol inhaler to use as needed for cough, wheezing, shortness of breath. Cutting back on smoking could help. Follow up as needed for continued or worsening symptoms     ED Prescriptions    Medication Sig Dispense Auth. Provider   predniSONE (STERAPRED UNI-PAK 21 TAB) 10 MG (21) TBPK tablet 6 tabs for 1 day, then 5 tabs for 1 das, then 4 tabs for 1 day, then 3 tabs for 1 day, 2 tabs for 1 day, then 1 tab for 1 day 21 tablet Aeliana Spates A, NP   albuterol (PROVENTIL HFA;VENTOLIN HFA) 108 (90 Base) MCG/ACT inhaler Inhale 1-2 puffs into the lungs every 6 (six) hours as needed for wheezing or shortness of breath. 1 Inhaler Ariona Deschene A, NP   dextromethorphan-guaiFENesin (MUCINEX DM) 30-600 MG 12hr tablet Take 1 tablet by mouth 2 (two) times daily. 30 tablet Dahlia Byes A, NP     Controlled Substance Prescriptions [place] Controlled Substance Registry consulted? Not Applicable   Janace Aris, NP 09/09/18 1610

## 2018-09-09 NOTE — Discharge Instructions (Addendum)
It was nice meeting you!!  Your x-ray was negative for pneumonia. We will go ahead and treat you for bronchitis with steroids, cough medicine and an albuterol inhaler to use as needed for cough, wheezing, shortness of breath. Cutting back on smoking could help. Follow up as needed for continued or worsening symptoms

## 2019-01-09 ENCOUNTER — Encounter (HOSPITAL_COMMUNITY): Payer: Self-pay | Admitting: Emergency Medicine

## 2019-01-09 ENCOUNTER — Emergency Department (HOSPITAL_COMMUNITY)
Admission: EM | Admit: 2019-01-09 | Discharge: 2019-01-09 | Disposition: A | Discharge status: Home / Self Care | Payer: BLUE CROSS/BLUE SHIELD | Attending: Emergency Medicine | Admitting: Emergency Medicine

## 2019-01-09 DIAGNOSIS — I10 Essential (primary) hypertension: Secondary | ICD-10-CM | POA: Insufficient documentation

## 2019-01-09 DIAGNOSIS — J069 Acute upper respiratory infection, unspecified: Secondary | ICD-10-CM

## 2019-01-09 DIAGNOSIS — H1032 Unspecified acute conjunctivitis, left eye: Secondary | ICD-10-CM | POA: Insufficient documentation

## 2019-01-09 DIAGNOSIS — Z7982 Long term (current) use of aspirin: Secondary | ICD-10-CM | POA: Insufficient documentation

## 2019-01-09 DIAGNOSIS — F17210 Nicotine dependence, cigarettes, uncomplicated: Secondary | ICD-10-CM | POA: Insufficient documentation

## 2019-01-09 DIAGNOSIS — J0110 Acute frontal sinusitis, unspecified: Secondary | ICD-10-CM | POA: Insufficient documentation

## 2019-01-09 MED ORDER — BENZONATATE 100 MG PO CAPS: 200.0000 mg | ORAL_CAPSULE | Freq: Three times a day (TID) | ORAL | 0 refills | Status: AC

## 2019-01-09 MED ORDER — ALBUTEROL SULFATE HFA 108 (90 BASE) MCG/ACT IN AERS
1.0000 | INHALATION_SPRAY | Freq: Once | RESPIRATORY_TRACT | Status: AC
  Administered 2019-01-09: 1 via RESPIRATORY_TRACT
  Filled 2019-01-09: qty 6.7

## 2019-01-09 MED ORDER — IPRATROPIUM-ALBUTEROL 0.5-2.5 (3) MG/3ML IN SOLN
3.0000 mL | Freq: Once | RESPIRATORY_TRACT | Status: AC
  Administered 2019-01-09: 3 mL via RESPIRATORY_TRACT
  Filled 2019-01-09: qty 3

## 2019-01-09 MED ORDER — CETIRIZINE HCL 5 MG PO TABS: 5.0000 mg | ORAL_TABLET | Freq: Every day | ORAL | 0 refills | Status: AC

## 2019-01-09 MED ORDER — SULFACETAMIDE SODIUM 10 % OP SOLN: 1.0000 [drp] | OPHTHALMIC | 0 refills | Status: AC

## 2019-01-09 MED ORDER — AEROCHAMBER PLUS FLO-VU MEDIUM MISC
1.0000 | Freq: Once | Status: AC
  Administered 2019-01-09: 1

## 2019-01-09 MED ORDER — FLUTICASONE PROPIONATE 50 MCG/ACT NA SUSP: 1.0000 | Freq: Every day | NASAL | 2 refills | Status: AC

## 2019-01-09 MED ORDER — AMOXICILLIN-POT CLAVULANATE 875-125 MG PO TABS: 1.0000 | ORAL_TABLET | Freq: Two times a day (BID) | ORAL | 0 refills | Status: AC

## 2019-01-09 MED ORDER — PREDNISONE 20 MG PO TABS
40.0000 mg | ORAL_TABLET | Freq: Once | ORAL | Status: AC
  Administered 2019-01-09: 40 mg via ORAL
  Filled 2019-01-09: qty 2

## 2019-01-09 MED ORDER — PREDNISONE 10 MG PO TABS: 40.0000 mg | ORAL_TABLET | Freq: Every day | ORAL | 0 refills | Status: AC

## 2019-01-09 NOTE — ED Provider Notes (Addendum)
[facility]-EMERGENCY DEPT Provider Note   CSN: [id number] Arrival date & time: 01/09/19  1127     History   Chief Complaint Chief Complaint  Patient presents with  . Eye Drainage  . Cough  . Nasal Congestion    HPI Lynn Pratt is a 58 y.o. female with a past medical history of hypertension who presents to ED for evaluation of 2-week history of cough productive with mucus, sinus pain and pressure, sore throat.  She also notes 1 week history of left eye redness and purulent drainage.  Sick contacts including her daughter and granddaughter with similar symptoms last week.  Patient has been taking Mucinex with no improvement in her symptoms.  She states that she woke up with her left eyelids matted together.  Her granddaughter who is 59 years old had similar symptoms as well.  She denies any recent travel, shortness of breath, trouble swallowing, trismus, drooling.  She did not receive her influenza vaccine this year.  HPI  Past Medical History:  Diagnosis Date  . High cholesterol   . Hypertension     Patient Active Problem List   Diagnosis Date Noted  . History of CVA (cerebrovascular accident) 08/24/2014  . Syncope and collapse 08/22/2014  . HTN (hypertension) 08/22/2014  . Nicotine abuse 08/22/2014    Past Surgical History:  Procedure Laterality Date  . ABDOMINAL HYSTERECTOMY    . CHOLECYSTECTOMY    . KNEE SURGERY    . SHOULDER SURGERY       OB History   No obstetric history on file.      Home Medications    Prior to Admission medications   Medication Sig Start Date End Date Taking? Authorizing Provider  albuterol (PROVENTIL HFA;VENTOLIN HFA) 108 (90 Base) MCG/ACT inhaler Inhale 1-2 puffs into the lungs every 6 (six) hours as needed for wheezing or shortness of breath. 09/09/18   Dahlia ByesBast, Traci A, NP  amoxicillin-clavulanate (AUGMENTIN) 875-125 MG tablet Take 1 tablet by mouth every 12 (twelve) hours. 01/09/19   Dietrich PatesKhatri, Josanne Boerema, PA-C  aspirin  EC 81 MG EC tablet Take 1 tablet (81 mg total) by mouth daily. 08/24/14   Clydia LlanoElmahi, Mutaz, MD  benzonatate (TESSALON) 100 MG capsule Take 2 capsules (200 mg total) by mouth every 8 (eight) hours. 01/09/19   Nashea Chumney, PA-C  cetirizine (ZYRTEC) 5 MG tablet Take 1 tablet (5 mg total) by mouth daily. 01/09/19   Flo Berroa, PA-C  dextromethorphan-guaiFENesin (MUCINEX DM) 30-600 MG 12hr tablet Take 1 tablet by mouth 2 (two) times daily. 09/09/18   Dahlia ByesBast, Traci A, NP  famotidine (PEPCID) 20 MG tablet Take 1 tablet twice daily as needed for hives. 01/25/18   Molpus, John, MD  fluticasone (FLONASE) 50 MCG/ACT nasal spray Place 1 spray into both nostrils daily. 01/09/19   Brenly Trawick, PA-C  hydrOXYzine (ATARAX/VISTARIL) 25 MG tablet Take 1-2 tablets (25-50 mg total) by mouth every 6 (six) hours as needed (for hives; may cause drowsiness). 01/25/18   Molpus, John, MD  losartan-hydrochlorothiazide (HYZAAR) 50-12.5 MG per tablet Take 1 tablet by mouth daily.    [provider]  predniSONE (DELTASONE) 10 MG tablet Take 4 tablets (40 mg total) by mouth daily for 4 days. 01/09/19 01/13/19  Mantaj Chamberlin, PA-C  sulfacetamide (BLEPH-10) 10 % ophthalmic solution Place 1-2 drops into the left eye every 4 (four) hours. 01/09/19   Dietrich PatesKhatri, Quindell Shere, PA-C    Family History No family history on file.  Social History Social History  Tobacco Use  . Smoking status: Current Every Day Smoker    Packs/day: 1.00  . Smokeless tobacco: Never Used  Substance Use Topics  . Alcohol use: No  . Drug use: No     Allergies   Simvastatin   Review of Systems Review of Systems  Constitutional: Negative for chills and fever.  HENT: Positive for congestion, sinus pressure, sinus pain and sore throat.   Eyes: Positive for discharge and redness. Negative for photophobia, pain, itching and visual disturbance.  Respiratory: Positive for cough.      Physical Exam Updated Vital Signs BP (!) 145/89 (BP Location: Left Arm)    Pulse 91   Temp 97.9 F (36.6 C) (Oral)   Resp 20   SpO2 90%   Physical Exam Vitals signs and nursing note reviewed.  Constitutional:      General: She is not in acute distress.    Appearance: She is well-developed. She is not diaphoretic.     Comments: Speaking complete sentences that difficulty.  HENT:     Head: Normocephalic and atraumatic.     Right Ear: A middle ear effusion is present.     Left Ear: A middle ear effusion is present.     Nose:     Right Sinus: Maxillary sinus tenderness and frontal sinus tenderness present.     Left Sinus: Maxillary sinus tenderness and frontal sinus tenderness present.     Mouth/Throat:     Pharynx: Oropharynx is clear. Uvula midline. Posterior oropharyngeal erythema present.     Tonsils: Swelling: 0 on the right. 0 on the left.     Comments: Patient does not appear to be in acute distress. No trismus or drooling present. No pooling of secretions. Patient is tolerating secretions and is not in respiratory distress. No neck pain or tenderness to palpation of the neck. Full active and passive range of motion of the neck. No evidence of RPA or PTA. Eyes:     General: No scleral icterus.    Extraocular Movements:     Right eye: Normal extraocular motion.     Left eye: Normal extraocular motion.     Conjunctiva/sclera: Conjunctivae normal.     Pupils: Pupils are equal, round, and reactive to light.     Comments: Left eye with injected conjunctiva, no eyelid swelling or erythema or tenderness to palpation.  Mild clear tearful drainage noted.  No foreign bodies noted.  No pain with EOMs.  No chemosis, proptosis, or consensual photophobia.  Neck:     Musculoskeletal: Normal range of motion.  Cardiovascular:     Rate and Rhythm: Normal rate and regular rhythm.     Heart sounds: Normal heart sounds.  Pulmonary:     Effort: Pulmonary effort is normal. No respiratory distress.     Breath sounds: Wheezing (Mild and expiratory bilaterally) present.    Skin:    Findings: No rash.  Neurological:     Mental Status: She is alert.      ED Treatments / Results  Labs (all labs ordered are listed, but only abnormal results are displayed) Labs Reviewed - No data to display  EKG None  Radiology No results found.  Procedures Procedures (including critical care time)  Medications Ordered in ED Medications  albuterol (PROVENTIL HFA;VENTOLIN HFA) 108 (90 Base) MCG/ACT inhaler 1 puff (1 puff Inhalation Given 01/09/19 1259)  AEROCHAMBER PLUS FLO-VU MEDIUM MISC 1 each (1 each Other Given 01/09/19 1259)  ipratropium-albuterol (DUONEB) 0.5-2.5 (3) MG/3ML nebulizer solution 3 mL (  3 mLs Nebulization Given 01/09/19 1340)  predniSONE (DELTASONE) tablet 40 mg (40 mg Oral Given 01/09/19 1340)     Initial Impression / Assessment and Plan / ED Course  I have reviewed the triage vital signs and the nursing notes.  Pertinent labs & imaging results that were available during my care of the patient were reviewed by me and considered in my medical decision making (see chart for details).     58 year old female presents to ED for 2-week history of sinus pain and pressure, cough productive with mucus and sore throat.  She also reports left eye redness, drainage.  Sick contacts including her daughter and granddaughter with similar symptoms.  On my exam the left eye is with mild conjunctival injection, clear drainage.  Tenderness palpation of the maxillary and frontal sinuses.  Lungs are clear to auscultation with the exception of mild end expiratory wheezing bilaterally.  She notes history of similar symptoms about 4 months ago which improved with steroids, inhaler.  No tonsillar enlargement noted.  No signs of RPA or PTA noted on exam.  We will treat her for acute bacterial sinusitis as well since her symptoms have been going on for 2 weeks.  Also give symptomatic treatment and advised PCP follow-up.  She will be given sulfacetamide for what appears to be  conjunctivitis of the left eye.  Doubt iritis, keratitis, corneal abrasion or other emergent cause of symptoms.  Advised to return to ED for any severe worsening symptoms.   2:03 PM Patient with oxygen saturations at 90% on room air prior to discharge.  She was given a DuoNeb and first dose of prednisone here.  She states that she feels "a lot more open" and declines additional breathing treatment.  Oxygen saturations improved to 93% on my recheck.  Suspect that this is consistent with her asthma exacerbation.   Patient is hemodynamically stable, in NAD, and able to ambulate in the ED. Evaluation does not show pathology that would require ongoing emergent intervention or inpatient treatment. I explained the diagnosis to the patient. Pain has been managed and has no complaints prior to discharge. Patient is comfortable with above plan and is stable for discharge at this time. All questions were answered prior to disposition. Strict return precautions for returning to the ED were discussed. Encouraged follow up with PCP.    Portions of this note were generated with Scientist, clinical (histocompatibility and immunogenetics). Dictation errors may occur despite best attempts at proofreading.  Final Clinical Impressions(s) / ED Diagnoses   Final diagnoses:  Acute conjunctivitis of left eye, unspecified acute conjunctivitis type  Acute frontal sinusitis, recurrence not specified  Viral upper respiratory tract infection    ED Discharge Orders         Ordered    sulfacetamide (BLEPH-10) 10 % ophthalmic solution  Every 4 hours     01/09/19 1243    amoxicillin-clavulanate (AUGMENTIN) 875-125 MG tablet  Every 12 hours     01/09/19 1243    fluticasone (FLONASE) 50 MCG/ACT nasal spray  Daily     01/09/19 1243    benzonatate (TESSALON) 100 MG capsule  Every 8 hours     01/09/19 1243    cetirizine (ZYRTEC) 5 MG tablet  Daily     01/09/19 1243    predniSONE (DELTASONE) 10 MG tablet  Daily     01/09/19 1246               Dietrich Pates, PA-C 01/09/19 1404    Pricilla Loveless, MD  01/12/19 0803  

## 2019-01-09 NOTE — ED Triage Notes (Signed)
Pt c/o sore throat that started 2 weeks ago then started having congestion, cough and since Saturday started having left eye crusted over when wake up in mornings.

## 2019-01-09 NOTE — Discharge Instructions (Addendum)
Return to ED for worsening symptoms, trouble breathing, trouble swallowing, trouble opening your mouth, shortness of breath. Please complete the entire course of antibiotics and steroids regardless of symptom improvement to prevent worsening or recurrence of your symptoms.

## 2020-02-16 IMAGING — DX DG CHEST 2V
2 series · 2 of 2 positions shown · non-contrast
Comparison: October 31, 2016

CLINICAL DATA: Cough for 2 weeks

EXAM:
CHEST - 2 VIEW

[chest pa]
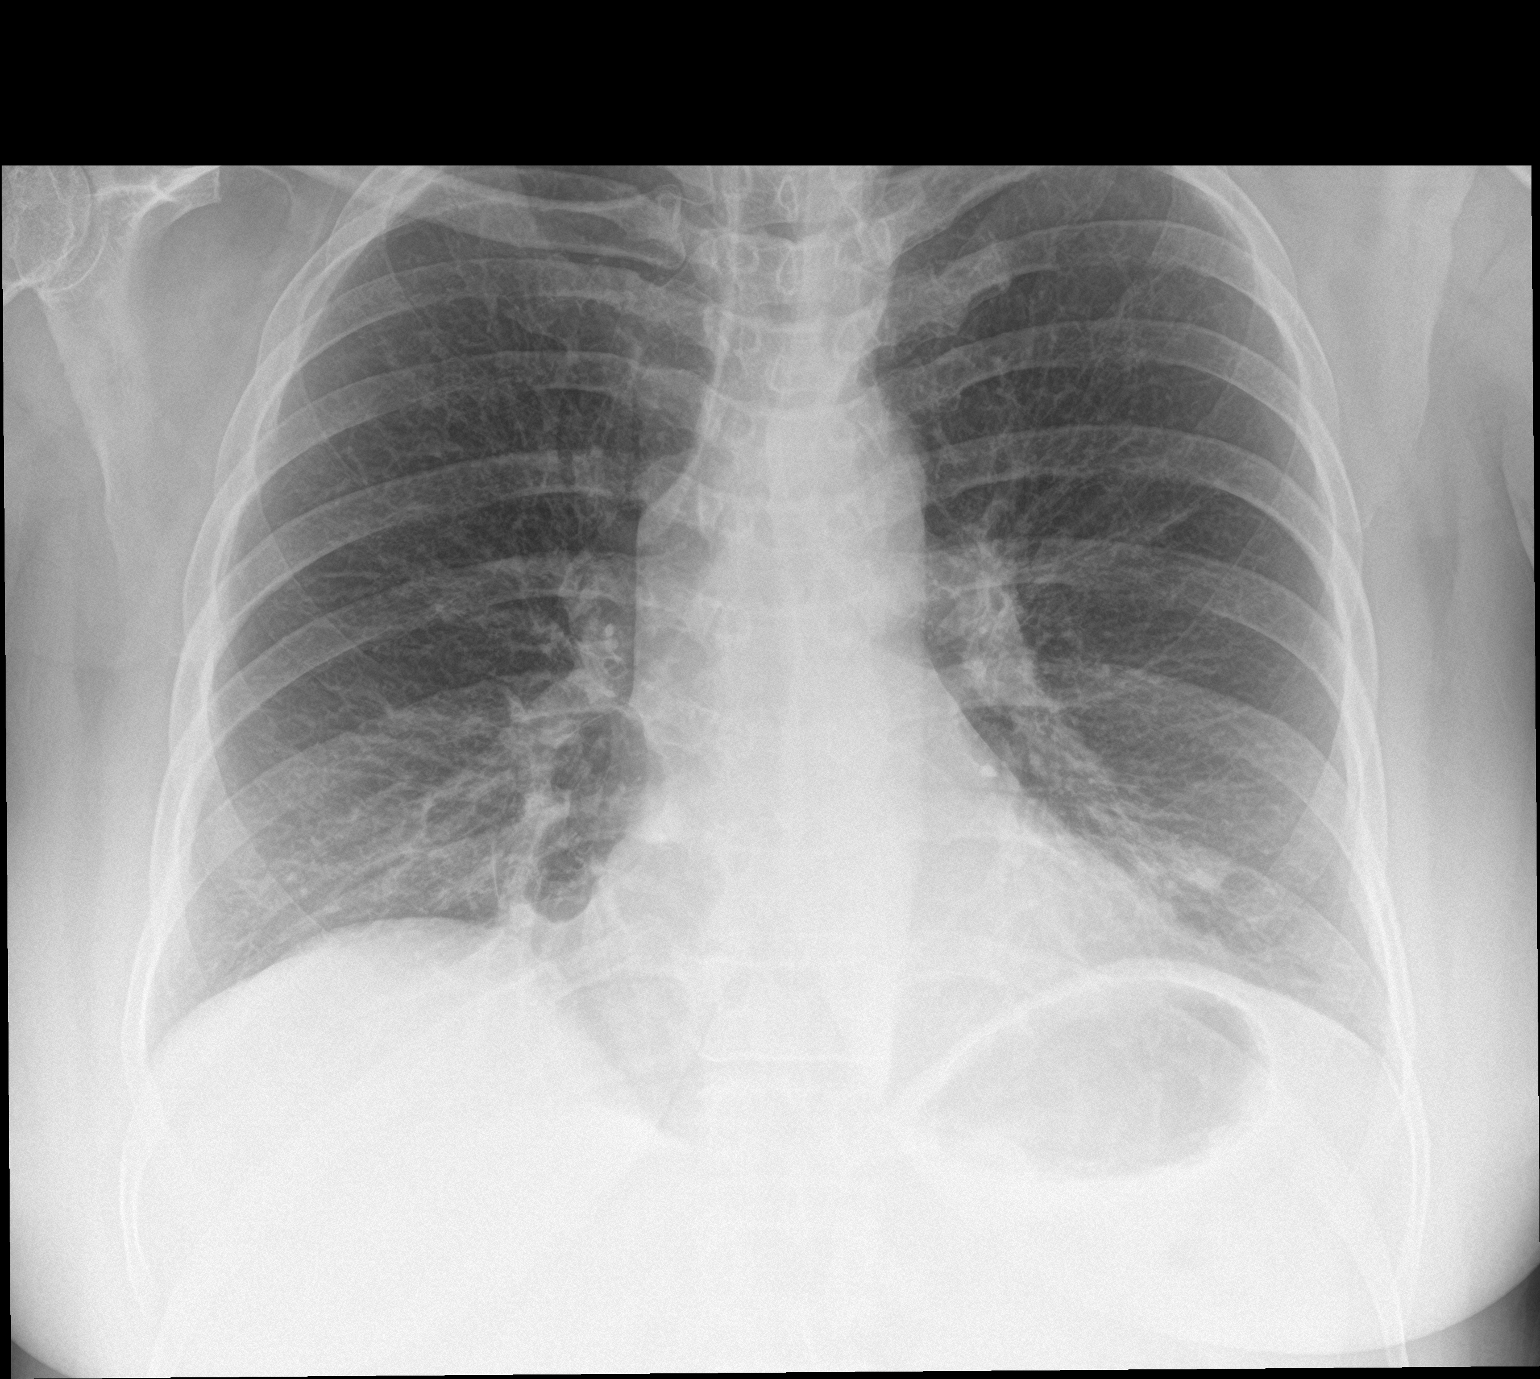

[chest lat]
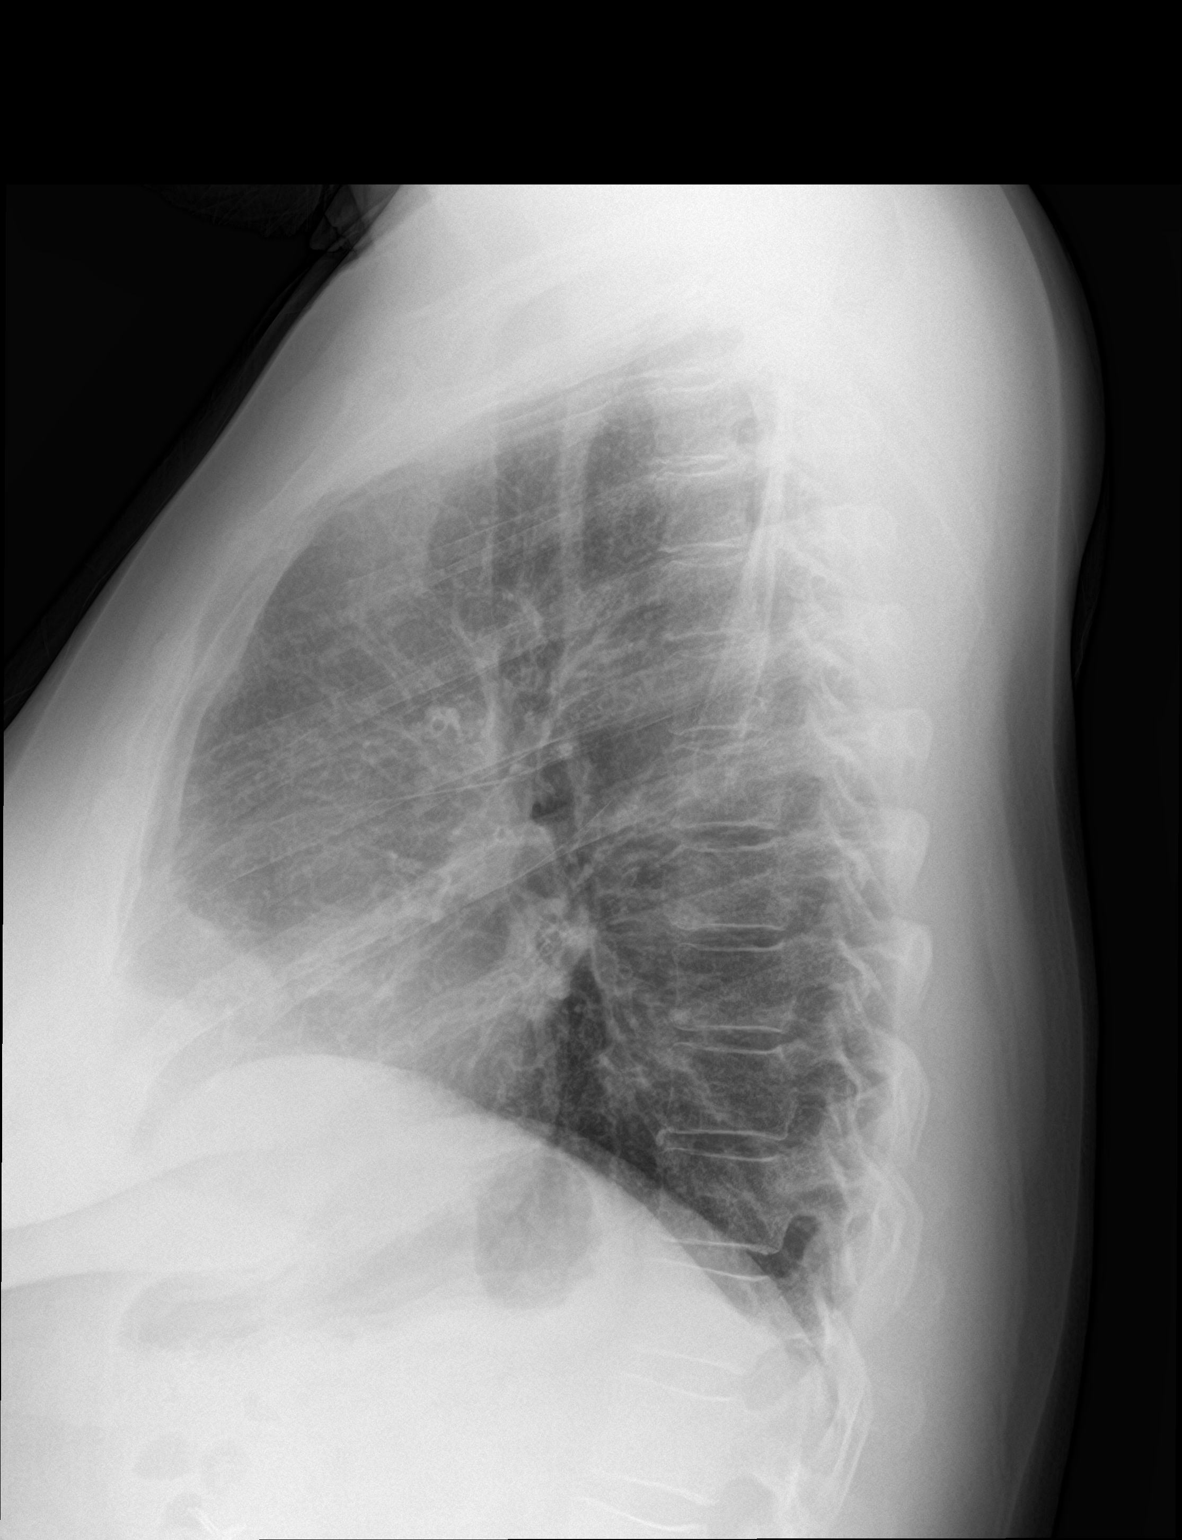

[2 of 2 positions shown; findings below may reference images not displayed]

FINDINGS: There is slight scarring in the right base. Lungs elsewhere clear.
Heart size and pulmonary vascular normal. No adenopathy. No evident
bone lesions.
IMPRESSION: Scarring right base. No edema or consolidation. Stable cardiac
silhouette.
